# Patient Record
Sex: Female | Born: 1980 | Race: Black or African American | Hispanic: No | Marital: Married | Smoking: Never smoker
Health system: Southern US, Community
[De-identification: ages and names within clinical notes are randomized; demographics above are authoritative.]

## PROBLEM LIST (undated history)

## (undated) ENCOUNTER — Inpatient Hospital Stay: Payer: Self-pay

## (undated) DIAGNOSIS — R87629 Unspecified abnormal cytological findings in specimens from vagina: Secondary | ICD-10-CM

## (undated) DIAGNOSIS — N200 Calculus of kidney: Secondary | ICD-10-CM

## (undated) DIAGNOSIS — E559 Vitamin D deficiency, unspecified: Secondary | ICD-10-CM

## (undated) HISTORY — DX: Vitamin D deficiency, unspecified: E55.9

## (undated) HISTORY — DX: Calculus of kidney: N20.0

## (undated) HISTORY — DX: Unspecified abnormal cytological findings in specimens from vagina: R87.629

---

## 2010-12-31 DIAGNOSIS — R87629 Unspecified abnormal cytological findings in specimens from vagina: Secondary | ICD-10-CM

## 2010-12-31 HISTORY — DX: Unspecified abnormal cytological findings in specimens from vagina: R87.629

## 2012-12-31 DIAGNOSIS — O321XX0 Maternal care for breech presentation, not applicable or unspecified: Secondary | ICD-10-CM

## 2014-12-31 HISTORY — PX: OTHER SURGICAL HISTORY: SHX169

## 2016-07-10 ENCOUNTER — Encounter: Payer: Self-pay | Admitting: Obstetrics and Gynecology

## 2016-07-10 ENCOUNTER — Ambulatory Visit (INDEPENDENT_AMBULATORY_CARE_PROVIDER_SITE_OTHER): Payer: BLUE CROSS/BLUE SHIELD | Admitting: Obstetrics and Gynecology

## 2016-07-10 VITALS — BP 124/65 | HR 71 | Ht 69.0 in | Wt 156.6 lb

## 2016-07-10 DIAGNOSIS — Z331 Pregnant state, incidental: Secondary | ICD-10-CM | POA: Diagnosis not present

## 2016-07-10 DIAGNOSIS — Z98891 History of uterine scar from previous surgery: Secondary | ICD-10-CM | POA: Diagnosis not present

## 2016-07-10 DIAGNOSIS — Z3490 Encounter for supervision of normal pregnancy, unspecified, unspecified trimester: Secondary | ICD-10-CM | POA: Insufficient documentation

## 2016-07-10 DIAGNOSIS — N912 Amenorrhea, unspecified: Secondary | ICD-10-CM | POA: Diagnosis not present

## 2016-07-10 LAB — POCT URINE PREGNANCY: PREG TEST UR: POSITIVE — AB

## 2016-07-10 NOTE — Patient Instructions (Signed)
1. Return in 3 weeks for new OB nursing intake 2. Return in 5 weeks for new OB history and physical 3. Continue prenatal vitamins daily

## 2016-07-10 NOTE — Progress Notes (Signed)
GYN ENCOUNTER NOTE  Subjective:       Roberta Wagner is a 35 y.o. [id number] female is here for gynecologic evaluation of the following issues:  1. Pregnancy confirmation.    Patient reports mild nausea and breast tenderness. No vaginal bleeding or pelvic pain. Currently taking prenatal vitamins.    Gynecologic History Patient's last menstrual period was 05/28/2016 (exact date). Contraception: none Last Pap: Normal Last mammogram: N/A  Obstetric History OB History  Gravida Para Term Preterm AB SAB TAB Ectopic Multiple Living  5 2 2  2 1    2     # Outcome Date GA Lbr Len/2nd Weight Sex Delivery Anes PTL Lv  5 Current           4 SAB 2016          3 AB 2014          2 Term 2014   7 lb 1.8 oz (3.225 kg) F CS-LTranv   Y     Complications: Breech birth  1 Term 2012   7 lb 9.6 oz (3.447 kg) F Vag-Spont   Y      Past Medical History  Diagnosis Date  . Abnormal Pap smear of vagina 2012    colpo neg  . Kidney stone     Past Surgical History  Procedure Laterality Date  . Cesarean section  2014  . Kidney stone remove  2016    No current outpatient prescriptions on file prior to visit.   No current facility-administered medications on file prior to visit.    Allergies  Allergen Reactions  . Sulfa Antibiotics Hives    Social History   Social History  . Marital Status: Single    Spouse Name: N/A  . Number of Children: N/A  . Years of Education: N/A   Occupational History  . Not on file.   Social History Main Topics  . Smoking status: Never Smoker   . Smokeless tobacco: Not on file  . Alcohol Use: No  . Drug Use: No  . Sexual Activity: Yes    Birth Control/ Protection: None   Other Topics Concern  . Not on file   Social History Narrative  . 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 Breast cancer Neg Hx   . Cancer Neg Hx    . Diabetes Neg Hx   . Heart disease Neg Hx     The following portions of the patient's history were reviewed and updated as appropriate: allergies, current medications, past family history, past medical history, past social history, past surgical history and problem list.  Review of Systems Review of Systems - POSITIVE-mild nausea, breast tenderness Review of Systems - General ROS: negative for - chills, fatigue, fever, hot flashes, malaise or night sweats Hematological and Lymphatic ROS: negative for - bleeding problems or swollen lymph nodes Gastrointestinal ROS: negative for - abdominal pain, blood in stools, change in bowel habits and nausea/vomiting Musculoskeletal ROS: negative for - joint pain, muscle pain or muscular weakness Genito-Urinary ROS: negative for - change in menstrual cycle, dysmenorrhea, dyspareunia, dysuria, genital discharge, genital ulcers, hematuria, incontinence, irregular/heavy menses, nocturia or pelvic pain  Objective:   BP 124/65 mmHg  Pulse 71  Ht 5\' 9"  (1.753 m)  Wt 156 lb 9.6 oz (71.033 kg)  BMI 23.12 kg/m2  LMP 05/28/2016 (Exact Date) CONSTITUTIONAL: Well-developed, well-nourished female  in no acute distress.  Physical exam-deferred   Assessment:   1. Amenorrhea - POCT urine pregnancy  2. Pregnancy  LMP 05/28/2016   EDD 03/04/2017  EGA 6.1 weeks  3. History of cesarean section-breech     Plan:   1. Prenatal vitamins daily 2. Discussed VBAC 3.New OB counseling: The patient has been given an overview regarding routine prenatal care. Recommendations regarding diet, weight gain, and exercise in pregnancy were given. Prenatal testing, optional genetic testing, and ultrasound use in pregnancy were reviewed.  Benefits of Breast Feeding were discussed. The patient is encouraged to consider nursing her baby post partum.  A total of 25 minutes were spent face-to-face with the patient during this encounter and over half of that time involved  counseling and coordination of care.  Herold Harms, MD  Note: This dictation was prepared with Dragon dictation along with smaller phrase technology. Any transcriptional errors that result from this process are unintentional.

## 2016-07-19 ENCOUNTER — Telehealth: Payer: Self-pay | Admitting: Obstetrics and Gynecology

## 2016-07-19 MED ORDER — DOXYLAMINE-PYRIDOXINE 10-10 MG PO TBEC: 10.0000 mg | DELAYED_RELEASE_TABLET | Freq: Every day | ORAL | Status: DC

## 2016-07-19 NOTE — Telephone Encounter (Signed)
Pt had confirmation on 7/11. Now states she is very nauseated. Has vomiting x 2. She is able to hold liquids and food. Has urinated 3 x since 7am. Advised pt to drink Gatorade or Pedialyte. Eat small frequent meals. May try ginger ale or sprite. Phoned in diclegis. Gave pt instructions on med. Will f/u on sx at  nob intake.

## 2016-07-19 NOTE — Telephone Encounter (Signed)
Pt is very nauseated. 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

## 2016-07-24 ENCOUNTER — Ambulatory Visit: Payer: Self-pay | Admitting: Obstetrics and Gynecology

## 2016-07-31 ENCOUNTER — Ambulatory Visit (INDEPENDENT_AMBULATORY_CARE_PROVIDER_SITE_OTHER): Payer: BLUE CROSS/BLUE SHIELD | Admitting: Obstetrics and Gynecology

## 2016-07-31 VITALS — BP 120/60 | HR 73 | Wt 162.0 lb

## 2016-07-31 DIAGNOSIS — Z369 Encounter for antenatal screening, unspecified: Secondary | ICD-10-CM

## 2016-07-31 DIAGNOSIS — Z1389 Encounter for screening for other disorder: Secondary | ICD-10-CM

## 2016-07-31 DIAGNOSIS — Z331 Pregnant state, incidental: Secondary | ICD-10-CM

## 2016-07-31 DIAGNOSIS — Z113 Encounter for screening for infections with a predominantly sexual mode of transmission: Secondary | ICD-10-CM

## 2016-07-31 DIAGNOSIS — Z36 Encounter for antenatal screening of mother: Secondary | ICD-10-CM

## 2016-07-31 DIAGNOSIS — Z3490 Encounter for supervision of normal pregnancy, unspecified, unspecified trimester: Secondary | ICD-10-CM

## 2016-07-31 NOTE — Progress Notes (Signed)
Roberta Wagner presents for NOB nurse interview visit. G-5.  P-2022. Pregnancy education material explained and given. No cats in the home. NOB labs ordered. HIV labs and Drug screen were explained optional and she could opt out of tests but did not decline. Drug screen ordered. PNV encouraged. Vitamin B6 and unisom OTC are helping her n/v. To contact office if needs anything else for n/v. Genetic screening at present time is questionable as to what test, may either call to have done before nob visit or discuss with provider. Pt. To follow up with provider on  08/14/2016  for NOB physical.  All questions answered.

## 2016-07-31 NOTE — Patient Instructions (Signed)
Pregnancy and Zika Virus Disease Zika virus disease, or Zika, is an illness that can spread to people from mosquitoes that carry the virus. It may also spread from person to person through infected body fluids. Zika first occurred in [place], but recently it has spread to new areas. The virus occurs in tropical climates. The location of Zika continues to change. Most people who become infected with Zika virus do not develop serious illness. However, Zika may cause birth defects in an unborn baby whose mother is infected with the virus. It may also increase the risk of miscarriage. WHAT ARE THE SYMPTOMS OF ZIKA VIRUS DISEASE? In many cases, people who have been infected with Zika virus do not develop any symptoms. If symptoms appear, they usually start about a week after the person is infected. Symptoms are usually mild. They may include:  Fever.  Rash.  Red eyes.  Joint pain. HOW DOES ZIKA VIRUS DISEASE SPREAD? The main way that Zika virus spreads is through the bite of a certain type of mosquito. Unlike most types of mosquitos, which bite only at night, the type of mosquito that carries Zika virus bites both at night and during the day. Zika virus can also spread through sexual contact, through a blood transfusion, and from a mother to her baby before or during birth. Once you have had Zika virus disease, it is unlikely that you will get it again. CAN I PASS ZIKA TO MY BABY DURING PREGNANCY? Yes, Zika can pass from a mother to her baby before or during birth. WHAT PROBLEMS CAN ZIKA CAUSE FOR MY BABY? A woman who is infected with Zika virus while pregnant is at risk of having her baby born with a condition in which the brain or head is smaller than expected (microcephaly). Babies who have microcephaly can have developmental delays, seizures, hearing problems, and vision problems. Having Zika virus disease during pregnancy can also increase the risk of miscarriage. HOW CAN ZIKA VIRUS DISEASE BE  PREVENTED? There is no vaccine to prevent Zika. The best way to prevent the disease is to avoid infected mosquitoes and avoid exposure to body fluids that can spread the virus. Avoid any possible exposure to Zika by taking the following precautions. For women and their sex partners:  Avoid traveling to high-risk areas. The locations where Zika is being reported change often. To identify high-risk areas, check the CDC travel website: www.cdc.gov/zika/geo/index.html  If you or your sex partner must travel to a high-risk area, talk with a health care provider before and after traveling.  Take all precautions to avoid mosquito bites if you live in, or travel to, any of the high-risk areas. Insect repellents are safe to use during pregnancy.  Ask your health care provider when it is safe to have sexual contact. For women:  If you are pregnant or trying to become pregnant, avoid sexual contact with persons who may have been exposed to Zika virus, persons who have possible symptoms of Zika, or persons whose history you are unsure about. If you choose to have sexual contact with someone who may have been exposed to Zika virus, use condoms correctly during the entire duration of sexual activity, every time. Do not share sexual devices, as you may be exposed to body fluids.  Ask your health care provider about when it is safe to attempt pregnancy after a possible exposure to Zika virus. WHAT STEPS SHOULD I TAKE TO AVOID MOSQUITO BITES? Take these steps to avoid mosquito bites when you are   in a high-risk area:  Wear loose clothing that covers your arms and legs.  Limit your outdoor activities.  Do not open windows unless they have window screens.  Sleep under mosquito nets.  Use insect repellent. The best insect repellents have:  DEET, picaridin, oil of lemon eucalyptus (OLE), or IR3535 in them.  Higher amounts of an active ingredient in them.  Remember that insect repellents are safe to use  during pregnancy.  Do not use OLE on children who are younger than 3 years of age. Do not use insect repellent on babies who are younger than 2 months of age.  Cover your child's stroller with mosquito netting. Make sure the netting fits snugly and that any loose netting does not cover your child's mouth or nose. Do not use a blanket as a mosquito-protection cover.  Do not apply insect repellent underneath clothing.  If you are using sunscreen, apply the sunscreen before applying the insect repellent.  Treat clothing with permethrin. Do not apply permethrin directly to your skin. Follow label directions for safe use.  Get rid of standing water, where mosquitoes may reproduce. Standing water is often found in items such as buckets, bowls, animal food dishes, and flowerpots. When you return from traveling to any high-risk area, continue taking actions to protect yourself against mosquito bites for 3 weeks, even if you show no signs of illness. This will prevent spreading Zika virus to uninfected mosquitoes. WHAT SHOULD I KNOW ABOUT THE SEXUAL TRANSMISSION OF ZIKA? People can spread Zika to their sexual partners during vaginal, anal, or oral sex, or by sharing sexual devices. Many people with Zika do not develop symptoms, so a person could spread the disease without knowing that they are infected. The greatest risk is to women who are pregnant or who may become pregnant. Zika virus can live longer in semen than it can live in blood. Couples can prevent sexual transmission of the virus by:  Using condoms correctly during the entire duration of sexual activity, every time. This includes vaginal, anal, and oral sex.  Not sharing sexual devices. Sharing increases your risk of being exposed to body fluid from another person.  Avoiding all sexual activity until your health care provider says it is safe. SHOULD I BE TESTED FOR ZIKA VIRUS? A sample of your blood can be tested for Zika virus. A pregnant  woman should be tested if she may have been exposed to the virus or if she has symptoms of Zika. She may also have additional tests done during her pregnancy, such ultrasound testing. Talk with your health care provider about which tests are recommended.   This information is not intended to replace advice given to you by your health care provider. Make sure you discuss any questions you have with your health care provider.   Document Released: 09/07/2015 Document Reviewed: 08/31/2015 Elsevier Interactive Patient Education 2016 Elsevier Inc. Minor Illnesses and Medications in Pregnancy  Cold/Flu:  Sudafed for congestion- Robitussin (plain) for cough- Tylenol for discomfort.  Please follow the directions on the label.  Try not to take any more than needed.  OTC Saline nasal spray and air humidifier or cool-mist  Vaporizer to sooth nasal irritation and to loosen congestion.  It is also important to increase intake of non carbonated fluids, especially if you have a fever.  Constipation:  Colace-2 capsules at bedtime; Metamucil- follow directions on label; Senokot- 1 tablet at bedtime.  Any one of these medications can be used.  It is also   very important to increase fluids and fruits along with regular exercise.  If problem persists please call the office.  Diarrhea:  Kaopectate as directed on the label.  Eat a bland diet and increase fluids.  Avoid highly seasoned foods.  Headache:  Tylenol 1 or 2 tablets every 3-4 hours as needed  Indigestion:  Maalox, Mylanta, Tums or Rolaids- as directed on label.  Also try to eat small meals and avoid fatty, greasy or spicy foods.  Nausea with or without Vomiting:  Nausea in pregnancy is caused by increased levels of hormones in the body which influence the digestive system and cause irritation when stomach acids accumulate.  Symptoms usually subside after 1st trimester of pregnancy.  Try the following: 1. Keep saltines, graham crackers or dry toast by your bed  to eat upon awakening. 2. Don't let your stomach get empty.  Try to eat 5-6 small meals per day instead of 3 large ones. 3. Avoid greasy fatty or highly seasoned foods.  4. Take OTC Unisom 1 tablet at bed time along with OTC Vitamin B6 25-50 mg 3 times per day.    If nausea continues with vomiting and you are unable to keep down food and fluids you may need a prescription medication.  Please notify your provider.   Sore throat:  Chloraseptic spray, throat lozenges and or plain Tylenol.  Vaginal Yeast Infection:  OTC Monistat for 7 days as directed on label.  If symptoms do not resolve within a week notify provider.  If any of the above problems do not subside with recommended treatment please call the office for further assistance.   Do not take Aspirin, Advil, Motrin or Ibuprofen.  * * OTC= Over the counter Hyperemesis Gravidarum Hyperemesis gravidarum is a severe form of nausea and vomiting that happens during pregnancy. Hyperemesis is worse than morning sickness. It may cause you to have nausea or vomiting all day for many days. It may keep you from eating and drinking enough food and liquids. Hyperemesis usually occurs during the first half (the first 20 weeks) of pregnancy. It often goes away once a woman is in her second half of pregnancy. However, sometimes hyperemesis continues through an entire pregnancy.  CAUSES  The cause of this condition is not completely known but is thought to be related to changes in the body's hormones when pregnant. It could be from the high level of the pregnancy hormone or an increase in estrogen in the body.  SIGNS AND SYMPTOMS   Severe nausea and vomiting.  Nausea that does not go away.  Vomiting that does not allow you to keep any food down.  Weight loss and body fluid loss (dehydration).  Having no desire to eat or not liking food you have previously enjoyed. DIAGNOSIS  Your health care provider will do a physical exam and ask you about your  symptoms. He or she may also order blood tests and urine tests to make sure something else is not causing the problem.  TREATMENT  You may only need medicine to control the problem. If medicines do not control the nausea and vomiting, you will be treated in the hospital to prevent dehydration, increased acid in the blood (acidosis), weight loss, and changes in the electrolytes in your body that may harm the unborn baby (fetus). You may need IV fluids.  HOME CARE INSTRUCTIONS   Only take over-the-counter or prescription medicines as directed by your health care provider.  Try eating a couple of dry crackers or   toast in the morning before getting out of bed.  Avoid foods and smells that upset your stomach.  Avoid fatty and spicy foods.  Eat 5-6 small meals a day.  Do not drink when eating meals. Drink between meals.  For snacks, eat high-protein foods, such as cheese.  Eat or suck on things that have ginger in them. Ginger helps nausea.  Avoid food preparation. The smell of food can spoil your appetite.  Avoid iron pills and iron in your multivitamins until after 3-4 months of being pregnant. However, consult with your health care provider before stopping any prescribed iron pills. SEEK MEDICAL CARE IF:   Your abdominal pain increases.  You have a severe headache.  You have vision problems.  You are losing weight. SEEK IMMEDIATE MEDICAL CARE IF:   You are unable to keep fluids down.  You vomit blood.  You have constant nausea and vomiting.  You have excessive weakness.  You have extreme thirst.  You have dizziness or fainting.  You have a fever or persistent symptoms for more than 2-3 days.  You have a fever and your symptoms suddenly get worse. MAKE SURE YOU:   Understand these instructions.  Will watch your condition.  Will get help right away if you are not doing well or get worse.   This information is not intended to replace advice given to you by your  health care provider. Make sure you discuss any questions you have with your health care provider.   Document Released: 12/17/2005 Document Revised: 10/07/2013 Document Reviewed: 07/29/2013 Elsevier Interactive Patient Education 2016 Elsevier Inc. Commonly Asked Questions During Pregnancy  Cats: A parasite can be excreted in cat feces.  To avoid exposure you need to have another person empty the little box.  If you must empty the litter box you will need to wear gloves.  Wash your hands after handling your cat.  This parasite can also be found in raw or undercooked meat so this should also be avoided.  Colds, Sore Throats, Flu: Please check your medication sheet to see what you can take for symptoms.  If your symptoms are unrelieved by these medications please call the office.  Dental Work: Most any dental work your dentist recommends is permitted.  X-rays should only be taken during the first trimester if absolutely necessary.  Your abdomen should be shielded with a lead apron during all x-rays.  Please notify your provider prior to receiving any x-rays.  Novocaine is fine; gas is not recommended.  If your dentist requires a note from us prior to dental work please call the office and we will provide one for you.  Exercise: Exercise is an important part of staying healthy during your pregnancy.  You may continue most exercises you were accustomed to prior to pregnancy.  Later in your pregnancy you will most likely notice you have difficulty with activities requiring balance like riding a bicycle.  It is important that you listen to your body and avoid activities that put you at a higher risk of falling.  Adequate rest and staying well hydrated are a must!  If you have questions about the safety of specific activities ask your provider.    Exposure to Children with illness: Try to avoid obvious exposure; report any symptoms to us when noted,  If you have chicken pos, red measles or mumps, you should  be immune to these diseases.   Please do not take any vaccines while pregnant unless you have checked with   your OB provider.  Fetal Movement: After 28 weeks we recommend you do "kick counts" twice daily.  Lie or sit down in a calm quiet environment and count your baby movements "kicks".  You should feel your baby at least 10 times per hour.  If you have not felt 10 kicks within the first hour get up, walk around and have something sweet to eat or drink then repeat for an additional hour.  If count remains less than 10 per hour notify your provider.  Fumigating: Follow your pest control agent's advice as to how long to stay out of your home.  Ventilate the area well before re-entering.  Hemorrhoids:   Most over-the-counter preparations can be used during pregnancy.  Check your medication to see what is safe to use.  It is important to use a stool softener or fiber in your diet and to drink lots of liquids.  If hemorrhoids seem to be getting worse please call the office.   Hot Tubs:  Hot tubs Jacuzzis and saunas are not recommended while pregnant.  These increase your internal body temperature and should be avoided.  Intercourse:  Sexual intercourse is safe during pregnancy as long as you are comfortable, unless otherwise advised by your provider.  Spotting may occur after intercourse; report any bright red bleeding that is heavier than spotting.  Labor:  If you know that you are in labor, please go to the hospital.  If you are unsure, please call the office and let us help you decide what to do.  Lifting, straining, etc:  If your job requires heavy lifting or straining please check with your provider for any limitations.  Generally, you should not lift items heavier than that you can lift simply with your hands and arms (no back muscles)  Painting:  Paint fumes do not harm your pregnancy, but may make you ill and should be avoided if possible.  Latex or water based paints have less odor than oils.   Use adequate ventilation while painting.  Permanents & Hair Color:  Chemicals in hair dyes are not recommended as they cause increase hair dryness which can increase hair loss during pregnancy.  " Highlighting" and permanents are allowed.  Dye may be absorbed differently and permanents may not hold as well during pregnancy.  Sunbathing:  Use a sunscreen, as skin burns easily during pregnancy.  Drink plenty of fluids; avoid over heating.  Tanning Beds:  Because their possible side effects are still unknown, tanning beds are not recommended.  Ultrasound Scans:  Routine ultrasounds are performed at approximately 20 weeks.  You will be able to see your baby's general anatomy an if you would like to know the gender this can usually be determined as well.  If it is questionable when you conceived you may also receive an ultrasound early in your pregnancy for dating purposes.  Otherwise ultrasound exams are not routinely performed unless there is a medical necessity.  Although you can request a scan we ask that you pay for it when conducted because insurance does not cover " patient request" scans.  Work: If your pregnancy proceeds without complications you may work until your due date, unless your physician or employer advises otherwise.  Round Ligament Pain/Pelvic Discomfort:  Sharp, shooting pains not associated with bleeding are fairly common, usually occurring in the second trimester of pregnancy.  They tend to be worse when standing up or when you remain standing for long periods of time.  These are the result   of pressure of certain pelvic ligaments called "round ligaments".  Rest, Tylenol and heat seem to be the most effective relief.  As the womb and fetus grow, they rise out of the pelvis and the discomfort improves.  Please notify the office if your pain seems different than that described.  It may represent a more serious condition.   

## 2016-08-01 LAB — SICKLE CELL SCREEN: Sickle Cell Screen: NEGATIVE

## 2016-08-01 LAB — VARICELLA ZOSTER ANTIBODY, IGG: VARICELLA: 1468 {index} (ref >165)

## 2016-08-01 LAB — CBC WITH DIFFERENTIAL/PLATELET
BASOS ABS: 0 10*3/uL (ref 0.0–0.2)
Basos: 0 %
EOS (ABSOLUTE): 0 10*3/uL (ref 0.0–0.4)
Eos: 1 %
Hematocrit: 41.2 % (ref 34.0–46.6)
Hemoglobin: 13.9 g/dL (ref 11.1–15.9)
Immature Grans (Abs): 0 10*3/uL (ref 0.0–0.1)
Immature Granulocytes: 0 %
LYMPHS ABS: 1.7 10*3/uL (ref 0.7–3.1)
Lymphs: 26 %
MCH: 28.9 pg (ref 26.6–33.0)
MCHC: 33.7 g/dL (ref 31.5–35.7)
MCV: 86 fL (ref 79–97)
MONOS ABS: 0.5 10*3/uL (ref 0.1–0.9)
Monocytes: 8 %
NEUTROS PCT: 65 %
Neutrophils Absolute: 4.2 10*3/uL (ref 1.4–7.0)
PLATELETS: 244 10*3/uL (ref 150–379)
RBC: 4.81 x10E6/uL (ref 3.77–5.28)
RDW: 13.5 % (ref 12.3–15.4)
WBC: 6.5 10*3/uL (ref 3.4–10.8)

## 2016-08-01 LAB — ABO

## 2016-08-01 LAB — HIV ANTIBODY (ROUTINE TESTING W REFLEX): HIV SCREEN 4TH GENERATION: NONREACTIVE

## 2016-08-01 LAB — HEPATITIS B SURFACE ANTIGEN: HEP B S AG: NEGATIVE

## 2016-08-01 LAB — ANTIBODY SCREEN: ANTIBODY SCREEN: NEGATIVE

## 2016-08-01 LAB — RUBELLA SCREEN: RUBELLA: 12.6 {index} (ref >0.99)

## 2016-08-01 LAB — RH TYPE: Rh Factor: POSITIVE

## 2016-08-01 LAB — RPR: RPR: NONREACTIVE

## 2016-08-02 LAB — MONITOR DRUG PROFILE 14(MW)
AMPHETAMINE SCREEN URINE: NEGATIVE ng/mL
BARBITURATE SCREEN URINE: NEGATIVE ng/mL
BENZODIAZEPINE SCREEN, URINE: NEGATIVE ng/mL
BUPRENORPHINE, URINE: NEGATIVE ng/mL
CANNABINOIDS UR QL SCN: NEGATIVE ng/mL
COCAINE(METAB.)SCREEN, URINE: NEGATIVE ng/mL
CREATININE(CRT), U: 189.3 mg/dL (ref 20.0–300.0)
FENTANYL, URINE: NEGATIVE pg/mL
METHADONE SCREEN, URINE: NEGATIVE ng/mL
Meperidine Screen, Urine: NEGATIVE ng/mL
OPIATE SCREEN URINE: NEGATIVE ng/mL
OXYCODONE+OXYMORPHONE UR QL SCN: NEGATIVE ng/mL
PHENCYCLIDINE QUANTITATIVE URINE: NEGATIVE ng/mL
PROPOXYPHENE SCREEN URINE: NEGATIVE ng/mL
Ph of Urine: 6.5 (ref 4.5–8.9)
SPECIFIC GRAVITY: 1.027
TRAMADOL SCREEN, URINE: NEGATIVE ng/mL

## 2016-08-02 LAB — URINALYSIS, ROUTINE W REFLEX MICROSCOPIC
Bilirubin, UA: NEGATIVE
Glucose, UA: NEGATIVE
Ketones, UA: NEGATIVE
LEUKOCYTES UA: NEGATIVE
Nitrite, UA: NEGATIVE
PH UA: 6.5 (ref 5.0–7.5)
PROTEIN UA: NEGATIVE
RBC, UA: NEGATIVE
Specific Gravity, UA: 1.025 (ref 1.005–1.030)
UUROB: 1 mg/dL (ref 0.2–1.0)

## 2016-08-02 LAB — GC/CHLAMYDIA PROBE AMP
CHLAMYDIA, DNA PROBE: NEGATIVE
NEISSERIA GONORRHOEAE BY PCR: NEGATIVE

## 2016-08-02 LAB — NICOTINE SCREEN, URINE: COTININE UR QL SCN: NEGATIVE ng/mL

## 2016-08-03 LAB — CULTURE, OB URINE

## 2016-08-03 LAB — URINE CULTURE, OB REFLEX

## 2016-08-14 ENCOUNTER — Encounter: Payer: Self-pay | Admitting: Obstetrics and Gynecology

## 2016-08-14 ENCOUNTER — Ambulatory Visit (INDEPENDENT_AMBULATORY_CARE_PROVIDER_SITE_OTHER): Payer: BLUE CROSS/BLUE SHIELD | Admitting: Obstetrics and Gynecology

## 2016-08-14 VITALS — BP 114/59 | HR 86 | Wt 163.2 lb

## 2016-08-14 DIAGNOSIS — O9989 Other specified diseases and conditions complicating pregnancy, childbirth and the puerperium: Secondary | ICD-10-CM

## 2016-08-14 DIAGNOSIS — Z3491 Encounter for supervision of normal pregnancy, unspecified, first trimester: Secondary | ICD-10-CM

## 2016-08-14 DIAGNOSIS — O09291 Supervision of pregnancy with other poor reproductive or obstetric history, first trimester: Secondary | ICD-10-CM

## 2016-08-14 DIAGNOSIS — O99891 Other specified diseases and conditions complicating pregnancy: Secondary | ICD-10-CM

## 2016-08-14 DIAGNOSIS — Z98891 History of uterine scar from previous surgery: Secondary | ICD-10-CM

## 2016-08-14 DIAGNOSIS — R8271 Bacteriuria: Secondary | ICD-10-CM

## 2016-08-14 DIAGNOSIS — Z3481 Encounter for supervision of other normal pregnancy, first trimester: Secondary | ICD-10-CM

## 2016-08-14 DIAGNOSIS — R110 Nausea: Secondary | ICD-10-CM

## 2016-08-14 DIAGNOSIS — Z8742 Personal history of other diseases of the female genital tract: Secondary | ICD-10-CM

## 2016-08-14 LAB — POCT URINALYSIS DIPSTICK
Bilirubin, UA: NEGATIVE
Blood, UA: NEGATIVE
Glucose, UA: NEGATIVE
KETONES UA: NEGATIVE
LEUKOCYTES UA: NEGATIVE
NITRITE UA: NEGATIVE
PH UA: 6.5
PROTEIN UA: NEGATIVE
Spec Grav, UA: 1.015
Urobilinogen, UA: NEGATIVE

## 2016-08-14 NOTE — Progress Notes (Signed)
OBSTETRIC INITIAL PRENATAL VISIT  Subjective:    Roberta Wagner is being seen today for her first obstetrical visit.  This is not a planned pregnancy. She is a 35 y.o. [id number] female at 2615w1d gestation, Estimated Date of Delivery: 03/04/17 with Patient's last menstrual period was 05/28/2016 (exact date).  Her obstetrical history is significant for h/o miscarriage. Relationship with FOB: significant other, living together. Patient does intend to breast feed. Pregnancy history fully reviewed.   Obstetric History   G5   P2   T2   P0   A2   L2    SAB0   TAB0   Ectopic0   Multiple0   Live Births2     # Outcome Date GA Lbr Len/2nd Weight Sex Delivery Anes PTL Lv  5 Current           4 SAB 2016          3 AB 2014          2 Term 2014 5895w0d  7 lb 1.8 oz (3.225 kg) F CS-LTranv   LIV     Complications: Breech birth  1 Term 2012 2510w0d  7 lb 9.6 oz (3.447 kg) F Vag-Spont   LIV      Gynecologic History:  Last pap smear was ~ 3-4 years ago. Results were normal.  Admits h/o abnormal pap smear in the past (2012, colposcopy was normal). .  Denies history of STIs.    Past Medical History:  Diagnosis Date  . Abnormal Pap smear of vagina 2012   colpo neg  . Kidney stone      Family History  Problem Relation Age of Onset  . Breast cancer Neg Hx   . Cancer Neg Hx   . Diabetes Neg Hx   . Heart disease Neg Hx      Past Surgical History:  Procedure Laterality Date  . CESAREAN SECTION  2014  . kidney stone remove  2016     Social History   Social History  . Marital status: Single    Spouse name: N/A  . Number of children: N/A  . Years of education: N/A   Occupational History  . Not on file.   Social History Main Topics  . Smoking status: Never Smoker  . Smokeless tobacco: Never Used  . Alcohol use No  . Drug use: No  . Sexual activity: Yes    Birth control/ protection: None     Comment: Pregnant    Other Topics Concern  . Not on file   Social History Narrative  .  No narrative on file     Current Outpatient Prescriptions on File Prior to Visit  Medication Sig Dispense Refill  . Doxylamine-Pyridoxine 10-10 MG TBEC Take 10 mg by mouth daily. 120 tablet 1  . Prenatal Vit-Fe Fumarate-FA (MULTIVITAMIN-PRENATAL) 27-0.8 MG TABS tablet Take 1 tablet by mouth daily at 12 noon.     No current facility-administered medications on file prior to visit.      Allergies  Allergen Reactions  . Sulfa Antibiotics Hives     Review of Systems General:Not Present- Fever, Weight Loss and Weight Gain. Skin:Not Present- Rash. HEENT:Not Present- Blurred Vision, Headache and Bleeding Gums. Respiratory:Not Present- Difficulty Breathing. Breast:Not Present- Breast Mass. Cardiovascular:Not Present- Chest Pain, Elevated Blood Pressure, Fainting / Blacking Out and Shortness of Breath. Gastrointestinal:Present - Nausea (controlled with Diclegis). Not Present- Abdominal Pain, Constipation, and Vomiting. Female Genitourinary:Not Present- Frequency, Painful Urination, Pelvic Pain, Vaginal Bleeding, Vaginal Discharge, Contractions,  regular, Fetal Movements Decreased, Urinary Complaints and Vaginal Fluid. Musculoskeletal:Not Present- Back Pain and Leg Cramps. Neurological:Not Present- Dizziness. Psychiatric:Not Present- Depression.     Objective:   Blood pressure (!) 114/59, pulse 86, weight 163 lb 3.2 oz (74 kg), last menstrual period 05/28/2016.  Body mass index is 24.1 kg/m.   General Appearance:    Alert, cooperative, no distress, appears stated age  Head:    Normocephalic, without obvious abnormality, atraumatic  Eyes:    PERRL, conjunctiva/corneas clear, EOM's intact, both eyes  Ears:    Normal external ear canals, both ears  Nose:   Nares normal, septum midline, mucosa normal, no drainage or sinus tenderness  Throat:   Lips, mucosa, and tongue normal; teeth and gums normal  Neck:   Supple, symmetrical, trachea midline, no adenopathy; thyroid: no  enlargement/tenderness/nodules; no carotid bruit or JVD  Back:     Symmetric, no curvature, ROM normal, no CVA tenderness  Lungs:     Clear to auscultation bilaterally, respirations unlabored  Chest Wall:    No tenderness or deformity   Heart:    Regular rate and rhythm, S1 and S2 normal, no murmur, rub or gallop  Breast Exam:    No tenderness, masses, or nipple abnormality  Abdomen:     Soft, non-tender, bowel sounds active all four quadrants, no masses, no organomegaly.  FHT 164 bpm.  Genitalia:    Pelvic:external genitalia normal, vagina without lesions, discharge, or tenderness, rectovaginal septum  normal. Cervix normal in appearance, no cervical motion tenderness, no adnexal masses or tenderness.  Pregnancy positive findings: uterine enlargement: 11 wk size, nontender.   Rectal:    Normal external sphincter.  No hemorrhoids appreciated. Internal exam not done.   Extremities:   Extremities normal, atraumatic, no cyanosis or edema  Pulses:   2+ and symmetric all extremities  Skin:   Skin color, texture, turgor normal, no rashes or lesions  Lymph nodes:   Cervical, supraclavicular, and axillary nodes normal  Neurologic:   CNII-XII intact, normal strength, sensation and reflexes throughout    Assessment:    Pregnancy at 11 and 1/7 weeks   Nausea of pregnancy  H/o abnormal pap smear  Asymptomatic bacteriuria H/o miscarriages H/o previous C-section  Plan:   1. Pregnancy at 11.1 weeks - Initial labs reviewed. - Prenatal vitamins encouraged. - Problem list reviewed and updated. - New OB counseling:  The patient has been given an overview regarding routine prenatal care.  Recommendations regarding diet, weight gain, and exercise in pregnancy were given.  - Prenatal testing, optional genetic testing, and ultrasound use in pregnancy were reviewed.  Panorama done today at patient's request.   enefits of Breast Feeding were discussed. The patient is encouraged to consider nursing her baby  post partum.  2. Nausea of pregnancy - Continue Diclegis as needed for nausea.   3. Asymptomatic bacteriuria - Klebsiella noted on NOB urine culture (only 25K-50K colonies noted).  Will repeat today. Will treat if over 75K colonies.   4. H/o abnormal pap smear - Pap smear done today.  5. H/o miscarriage - FHT appreciated today. Given precautions.   6 H/o 1 prior C-section.  -  Discussed option of TOLAC vs repeat C-section.  Patient is a candidate for TOLAC as indication for C-section was breech presentation.  Patient undecided, but may be leaning more towards TOLAC.  Will discuss again at future visits  Follow up in 4 weeks.  50% of 30 min visit spent on counseling and coordination of  care.

## 2016-08-15 LAB — URINE CULTURE

## 2016-08-16 ENCOUNTER — Encounter: Payer: Self-pay | Admitting: Obstetrics and Gynecology

## 2016-08-17 ENCOUNTER — Encounter: Payer: Self-pay | Admitting: Obstetrics and Gynecology

## 2016-08-17 DIAGNOSIS — R87610 Atypical squamous cells of undetermined significance on cytologic smear of cervix (ASC-US): Secondary | ICD-10-CM | POA: Insufficient documentation

## 2016-08-17 LAB — PAP IG AND HPV HIGH-RISK
HPV, high-risk: NEGATIVE
PAP SMEAR COMMENT: 0

## 2016-09-07 ENCOUNTER — Encounter: Payer: Self-pay | Admitting: Obstetrics and Gynecology

## 2016-09-18 ENCOUNTER — Ambulatory Visit (INDEPENDENT_AMBULATORY_CARE_PROVIDER_SITE_OTHER): Payer: BLUE CROSS/BLUE SHIELD | Admitting: Obstetrics and Gynecology

## 2016-09-18 VITALS — BP 119/55 | HR 77 | Wt 165.4 lb

## 2016-09-18 DIAGNOSIS — Z3492 Encounter for supervision of normal pregnancy, unspecified, second trimester: Secondary | ICD-10-CM

## 2016-09-18 DIAGNOSIS — Z3482 Encounter for supervision of other normal pregnancy, second trimester: Secondary | ICD-10-CM

## 2016-09-18 DIAGNOSIS — R87610 Atypical squamous cells of undetermined significance on cytologic smear of cervix (ASC-US): Secondary | ICD-10-CM

## 2016-09-18 LAB — POCT URINALYSIS DIPSTICK
Bilirubin, UA: NEGATIVE
Glucose, UA: NEGATIVE
Ketones, UA: NEGATIVE
NITRITE UA: NEGATIVE
PROTEIN UA: NEGATIVE
RBC UA: NEGATIVE
UROBILINOGEN UA: NEGATIVE
pH, UA: 6.5

## 2016-09-18 NOTE — Patient Instructions (Signed)

## 2016-09-18 NOTE — Progress Notes (Signed)
ROB: Doing well, no complaints. Changed to gummy vitamins, doing well.  Normal Panorama screen. Discussed pap smear (ASCUS but neg HPV).  Continue routine screening. RTC in 4 weeks, for anatomy scan at that time.

## 2016-10-01 ENCOUNTER — Telehealth: Payer: Self-pay | Admitting: Obstetrics and Gynecology

## 2016-10-01 NOTE — Telephone Encounter (Signed)
Okie this pt said she needs a note stating that she needs to stop going to the gym so they can freeze her account while she's pregnant. She said she just wants to walk. And I'm serious

## 2016-10-16 ENCOUNTER — Ambulatory Visit (INDEPENDENT_AMBULATORY_CARE_PROVIDER_SITE_OTHER): Payer: Self-pay | Admitting: Obstetrics and Gynecology

## 2016-10-16 ENCOUNTER — Ambulatory Visit (INDEPENDENT_AMBULATORY_CARE_PROVIDER_SITE_OTHER): Payer: Self-pay

## 2016-10-16 VITALS — BP 122/58 | HR 93 | Wt 171.0 lb

## 2016-10-16 DIAGNOSIS — Z3482 Encounter for supervision of other normal pregnancy, second trimester: Secondary | ICD-10-CM

## 2016-10-16 DIAGNOSIS — Z23 Encounter for immunization: Secondary | ICD-10-CM

## 2016-10-16 LAB — POCT URINALYSIS DIPSTICK
Bilirubin, UA: NEGATIVE
Glucose, UA: NEGATIVE
Ketones, UA: NEGATIVE
NITRITE UA: NEGATIVE
PH UA: 6.5
Spec Grav, UA: 1.02
UROBILINOGEN UA: NEGATIVE

## 2016-10-16 MED ORDER — CLONAZEPAM 0.5 MG PO TABS: 0.5000 mg | ORAL_TABLET | Freq: Two times a day (BID) | ORAL | 5 refills | Status: DC | PRN

## 2016-10-16 MED ORDER — ACYCLOVIR 400 MG PO TABS: 400.0000 mg | ORAL_TABLET | Freq: Two times a day (BID) | ORAL | 11 refills | Status: DC

## 2016-10-16 MED ORDER — LAMOTRIGINE 100 MG PO TABS: 100.0000 mg | ORAL_TABLET | Freq: Every day | ORAL | 6 refills | Status: DC

## 2016-10-16 NOTE — Progress Notes (Signed)
ROB: Doing well, no complaints.  For anatomy scan later today.  For flu vaccine today. RTC in 4 weeks.

## 2016-11-13 ENCOUNTER — Ambulatory Visit (INDEPENDENT_AMBULATORY_CARE_PROVIDER_SITE_OTHER): Payer: Medicaid Other | Admitting: Obstetrics and Gynecology

## 2016-11-13 VITALS — BP 114/60 | HR 96 | Wt 176.2 lb

## 2016-11-13 DIAGNOSIS — Z3482 Encounter for supervision of other normal pregnancy, second trimester: Secondary | ICD-10-CM

## 2016-11-13 DIAGNOSIS — Z131 Encounter for screening for diabetes mellitus: Secondary | ICD-10-CM

## 2016-11-13 LAB — POCT URINALYSIS DIPSTICK
Bilirubin, UA: NEGATIVE
Blood, UA: NEGATIVE
Glucose, UA: NEGATIVE
KETONES UA: NEGATIVE
NITRITE UA: NEGATIVE
PH UA: 6.5
PROTEIN UA: NEGATIVE
Spec Grav, UA: 1.02
UROBILINOGEN UA: NEGATIVE

## 2016-11-13 NOTE — Progress Notes (Signed)
ROB: Patient doing well, no complaints. RTC in 4 weeks. For 28 week labs at that time.

## 2016-12-13 ENCOUNTER — Telehealth: Payer: Self-pay

## 2016-12-13 ENCOUNTER — Ambulatory Visit (INDEPENDENT_AMBULATORY_CARE_PROVIDER_SITE_OTHER): Payer: Medicaid Other | Admitting: Obstetrics and Gynecology

## 2016-12-13 ENCOUNTER — Other Ambulatory Visit: Payer: Medicaid Other

## 2016-12-13 VITALS — BP 94/55 | HR 88 | Wt 185.4 lb

## 2016-12-13 DIAGNOSIS — Z131 Encounter for screening for diabetes mellitus: Secondary | ICD-10-CM

## 2016-12-13 DIAGNOSIS — Z3482 Encounter for supervision of other normal pregnancy, second trimester: Secondary | ICD-10-CM

## 2016-12-13 DIAGNOSIS — Z3483 Encounter for supervision of other normal pregnancy, third trimester: Secondary | ICD-10-CM | POA: Diagnosis not present

## 2016-12-13 DIAGNOSIS — Z23 Encounter for immunization: Secondary | ICD-10-CM

## 2016-12-13 DIAGNOSIS — R42 Dizziness and giddiness: Secondary | ICD-10-CM

## 2016-12-13 LAB — POCT URINALYSIS DIPSTICK
BILIRUBIN UA: NEGATIVE
Blood, UA: NEGATIVE
GLUCOSE UA: NEGATIVE
KETONES UA: NEGATIVE
LEUKOCYTES UA: NEGATIVE
Nitrite, UA: NEGATIVE
PROTEIN UA: NEGATIVE
SPEC GRAV UA: 1.01
Urobilinogen, UA: NEGATIVE
pH, UA: 7.5

## 2016-12-13 MED ORDER — TETANUS-DIPHTH-ACELL PERTUSSIS 5-2.5-18.5 LF-MCG/0.5 IM SUSP
0.5000 mL | Freq: Once | INTRAMUSCULAR | Status: AC
  Administered 2016-12-13: 0.5 mL via INTRAMUSCULAR

## 2016-12-13 NOTE — Telephone Encounter (Signed)
Call transferred from front desk. Pt states she went to urinate after taking a nap and saw blood on the TP. Was seen for rob this am. NO cramps. Pos FM. Pt did have IC this am. Advised pt to monitor. If bleeding persist, cramping, or pelvic pain to contact office. Pt voices understanding.

## 2016-12-13 NOTE — Progress Notes (Signed)
ROB: Patient c/o feeling dizzy and lightheaded often, today BP 94/55. Heart rate 88. Uncertain as to etiology of symptoms. Will check CBC and Glucola today. If anemic, we will recommend starting iron therapy in addition to prenatal vitamins. Patient is aware of plan and will wait for results before starting iron

## 2016-12-13 NOTE — Addendum Note (Signed)
Addended by: Frankey ShownGLANTON, Mcguire Gasparyan C on: 12/13/2016 09:50 AM   Modules accepted: Orders

## 2016-12-13 NOTE — Patient Instructions (Signed)
1. Glucola and CBC are obtained today 2. Begin iron therapy 1 tablet twice a day IF anemia is confirmed 3. Return in 2 weeks for regular OB appointment

## 2016-12-14 LAB — CBC
Hematocrit: 34 % (ref 34.0–46.6)
Hemoglobin: 11.7 g/dL (ref 11.1–15.9)
MCH: 28.3 pg (ref 26.6–33.0)
MCHC: 34.4 g/dL (ref 31.5–35.7)
MCV: 82 fL (ref 79–97)
Platelets: 238 10*3/uL (ref 150–379)
RBC: 4.14 x10E6/uL (ref 3.77–5.28)
RDW: 12.9 % (ref 12.3–15.4)
WBC: 7.2 10*3/uL (ref 3.4–10.8)

## 2016-12-14 LAB — GLUCOSE, 1 HOUR GESTATIONAL: GESTATIONAL DIABETES SCREEN: 76 mg/dL (ref 65–139)

## 2016-12-26 ENCOUNTER — Inpatient Hospital Stay
Admission: EM | Admit: 2016-12-26 | Discharge: 2016-12-27 | Disposition: A | Discharge status: Home / Self Care | Payer: Medicaid Other | Attending: Obstetrics and Gynecology | Admitting: Obstetrics and Gynecology

## 2016-12-26 DIAGNOSIS — O4700 False labor before 37 completed weeks of gestation, unspecified trimester: Secondary | ICD-10-CM

## 2016-12-26 DIAGNOSIS — Z3A30 30 weeks gestation of pregnancy: Secondary | ICD-10-CM | POA: Insufficient documentation

## 2016-12-26 DIAGNOSIS — O479 False labor, unspecified: Secondary | ICD-10-CM

## 2016-12-27 DIAGNOSIS — Z3A30 30 weeks gestation of pregnancy: Secondary | ICD-10-CM | POA: Diagnosis not present

## 2016-12-27 DIAGNOSIS — O479 False labor, unspecified: Secondary | ICD-10-CM

## 2016-12-27 DIAGNOSIS — O4700 False labor before 37 completed weeks of gestation, unspecified trimester: Secondary | ICD-10-CM

## 2016-12-27 LAB — URINALYSIS, ROUTINE W REFLEX MICROSCOPIC
Bilirubin Urine: NEGATIVE
Glucose, UA: NEGATIVE mg/dL
HGB URINE DIPSTICK: NEGATIVE
KETONES UR: NEGATIVE mg/dL
NITRITE: NEGATIVE
PROTEIN: NEGATIVE mg/dL
Specific Gravity, Urine: 1.014 (ref 1.005–1.030)
pH: 6 (ref 5.0–8.0)

## 2016-12-27 MED ORDER — ACETAMINOPHEN 500 MG PO TABS
1000.0000 mg | ORAL_TABLET | Freq: Once | ORAL | Status: AC
  Administered 2016-12-27: 1000 mg via ORAL
  Filled 2016-12-27: qty 2

## 2016-12-27 MED ORDER — LACTATED RINGERS IV SOLN
Freq: Once | INTRAVENOUS | Status: AC
  Administered 2016-12-27: 01:00:00 via INTRAVENOUS

## 2016-12-27 NOTE — OB Triage Note (Signed)
Patient came in for observation for labor evaluation. Patient reports contractions every three to five minutes. Patient denies leaking of fluid, denies vaginal bleeding and spotting. Patient states she has been feeling the baby move fine. Vital signs stable and patient afebrile. FHR baseline 145 with moderate variability with accelerations 15 x 15 and no decelerations. Husband at bedside. Will continue to monitor.

## 2016-12-27 NOTE — Discharge Summary (Signed)
Patient discharged with instructions on labor precautions, information for Tylenol for pain medication follow up appointment, when to seek medical attention. Patient discharged with steady gait and no complaints. Patient discharged with husband.

## 2016-12-31 NOTE — L&D Delivery Note (Signed)
Delivery Summary for Danaher CorporationMarshevet Diiorio  Labor Events:   Preterm labor:   Rupture date:   Rupture time:   Rupture type: Spontaneous  Fluid Color: Clear  Induction:   Augmentation:   Complications:   Cervical ripening:          Delivery:   Episiotomy:   Lacerations:   Repair suture:   Repair # of packets:   Blood loss (ml): 150   Information for the patient's newborn:  Jannett CelestineHooker, Boy Shainna [[id number]][[id number]]    Delivery 03/05/2017 9:26 PM by  VBAC, Spontaneous Sex:  female Gestational Age: 3541w1d Delivery Clinician:   Living?:         APGARS  One minute Five minutes Ten minutes  Skin color:        Heart rate:        Grimace:        Muscle tone:        Breathing:        Totals: 8  9      Presentation/position:      Resuscitation:   Cord information:    Disposition of cord blood:     Blood gases sent?  Complications:   Placenta: Delivered:       appearance Newborn Measurements: Weight: 7 lb 9.7 oz (3450 g)  Height: 19.69"  Head circumference:    Chest circumference:    Other providers:    Additional  information: Forceps:   Vacuum:   Breech:   Observed anomalies       Delivery Note At 9:26 PM a viable and healthy female was delivered via VBAC, Spontaneous (Presentation: vertex; LOA position).  APGAR: 8, 9; weight 7 lb 9.7 oz (3450 g).   Placenta status: spontaneously removed, intact.  Cord: 3-vessel with the following complications: None.  Cord pH: not obtained.  Anesthesia:  Epidural Episiotomy: None Lacerations: None Suture Repair: None Est. Blood Loss (mL):  150  Mom to postpartum.  Baby to Couplet care / Skin to Skin.  Hildred Lasernika Nazir Hacker 03/05/2017, 10:02 PM

## 2017-01-01 ENCOUNTER — Ambulatory Visit (INDEPENDENT_AMBULATORY_CARE_PROVIDER_SITE_OTHER): Payer: Medicaid Other | Admitting: Obstetrics and Gynecology

## 2017-01-01 VITALS — BP 113/64 | HR 80 | Wt 186.9 lb

## 2017-01-01 DIAGNOSIS — Z3483 Encounter for supervision of other normal pregnancy, third trimester: Secondary | ICD-10-CM

## 2017-01-01 DIAGNOSIS — O219 Vomiting of pregnancy, unspecified: Secondary | ICD-10-CM

## 2017-01-01 LAB — POCT URINALYSIS DIPSTICK
BILIRUBIN UA: NEGATIVE
GLUCOSE UA: NEGATIVE
KETONES UA: NEGATIVE
Nitrite, UA: NEGATIVE
PH UA: 7
Protein, UA: NEGATIVE
SPEC GRAV UA: 1.02
Urobilinogen, UA: NEGATIVE

## 2017-01-01 MED ORDER — DOXYLAMINE-PYRIDOXINE 10-10 MG PO TBEC: 10.0000 mg | DELAYED_RELEASE_TABLET | ORAL | 1 refills | Status: DC

## 2017-01-01 NOTE — Progress Notes (Signed)
ROB: Doing well.  Notes Roberta PeltonBraxton Hicks. Was seen in triage last weekend for strong, regular contractions, was ruled out for preterm labor. Notes intolerance to current iron pills.  Given Ferrolate samples.  Discussed blood consent and cord blood banking.  Received Tdap last visit.  Patient's insurance changed to Medicaid, completed home pregnancy form today. RTC in 2 weeks.

## 2017-01-01 NOTE — Progress Notes (Deleted)
Why?  

## 2017-01-06 ENCOUNTER — Observation Stay
Admission: EM | Admit: 2017-01-06 | Discharge: 2017-01-06 | Disposition: A | Discharge status: Home / Self Care | Payer: Medicaid Other | Attending: Obstetrics and Gynecology | Admitting: Obstetrics and Gynecology

## 2017-01-06 DIAGNOSIS — Z3490 Encounter for supervision of normal pregnancy, unspecified, unspecified trimester: Secondary | ICD-10-CM | POA: Diagnosis not present

## 2017-01-06 DIAGNOSIS — R112 Nausea with vomiting, unspecified: Secondary | ICD-10-CM | POA: Diagnosis not present

## 2017-01-06 DIAGNOSIS — K529 Noninfective gastroenteritis and colitis, unspecified: Secondary | ICD-10-CM | POA: Diagnosis not present

## 2017-01-06 DIAGNOSIS — O2693 Pregnancy related conditions, unspecified, third trimester: Secondary | ICD-10-CM | POA: Diagnosis not present

## 2017-01-06 DIAGNOSIS — O26893 Other specified pregnancy related conditions, third trimester: Secondary | ICD-10-CM | POA: Diagnosis present

## 2017-01-06 DIAGNOSIS — Z3A31 31 weeks gestation of pregnancy: Secondary | ICD-10-CM | POA: Insufficient documentation

## 2017-01-06 LAB — URINALYSIS, ROUTINE W REFLEX MICROSCOPIC
BILIRUBIN URINE: NEGATIVE
Glucose, UA: NEGATIVE mg/dL
Hgb urine dipstick: NEGATIVE
KETONES UR: 80 mg/dL — AB
Nitrite: NEGATIVE
PH: 6 (ref 5.0–8.0)
PROTEIN: 30 mg/dL — AB
Specific Gravity, Urine: 1.025 (ref 1.005–1.030)

## 2017-01-06 MED ORDER — LACTATED RINGERS IV SOLN
INTRAVENOUS | Status: DC
  Administered 2017-01-06: 04:00:00 via INTRAVENOUS

## 2017-01-06 MED ORDER — LOPERAMIDE HCL 2 MG PO CAPS
2.0000 mg | ORAL_CAPSULE | ORAL | Status: DC | PRN
  Administered 2017-01-06: 2 mg via ORAL
  Filled 2017-01-06: qty 1

## 2017-01-06 MED ORDER — ONDANSETRON 4 MG PO TBDP
4.0000 mg | ORAL_TABLET | Freq: Once | ORAL | Status: AC | PRN
  Administered 2017-01-06: 4 mg via ORAL
  Filled 2017-01-06: qty 1

## 2017-01-06 NOTE — Final Progress Note (Signed)
L&D OB Triage Note  Ricke HeyMarshevet Gray is a 36 y.o. Z6X0960G5P2022 female at 3739w4d, EDD Estimated Date of Delivery: 03/04/17 who presented to triage for complaints of contractions q 3-5 min.  She was evaluated by the nurses with /findings significant for preterm contractions, ruled out for labor. Vital signs stable. An NST was performed and has been reviewed by MD. She was treated with IVF bolus and 1 dose of terbutaline IM.   NST INTERPRETATION: Indications: rule out uterine contractions  Mode: External Baseline Rate (A): 145 bpm Variability: Moderate Accelerations: 15 x 15 Decelerations: None     Contraction Frequency (min): occas  Impression: reactive   Plan: NST performed was reviewed and was found to be reactive. She was discharged home with bleeding/lpreterm abor precautions.  Continue routine prenatal care. Follow up with OB/GYN as previously scheduled.     Hildred LaserAnika Romaldo Saville, MD [facility]

## 2017-01-06 NOTE — OB Triage Note (Signed)
Patient arrived to triage with complaints of N/V/D since approx 1900 last night.  Patient states she is "not able to keep anything down".  Positive fetal movement noted by patient and by palpation.  Patient denies feeling contractions, leaking of fluid, or vaginal bleeding.  EFM applied. Discussed plan of care. Patient verbalized understanding.

## 2017-01-06 NOTE — Progress Notes (Signed)
Nausea improved after ODT zofran, patient reports she has not had any "dry heaves" since administration. However, patient now feeling contractions. IV fluid bolus initiated as ordered.

## 2017-01-06 NOTE — OB Triage Note (Signed)
Patient resting in bed quietly with eyes closed, arouses to voice.  Patient states, "my stomach started hurting after I took that immodium".  Discussed plan of care for PO challenge. Patient does not feel she can tolerate crackers at this time- left at bedside for patient to eat when stomach pain eases. Discussed contractions.  Patient feels contractions but they are not painful at this time. Plan to PO challenge when ready, and continue IVF and EFM.

## 2017-01-06 NOTE — Progress Notes (Signed)
Dr. Valentino Saxonherry notified of UA results.  Aware that patient tolerating PO fluids, but unable to eat at this point in time.  Aware that patient has completed a 1 liter LR bolus.  Orders to discharge to home and encourage patient to continue to hydrate at home with PO fluids.

## 2017-01-06 NOTE — Discharge Summary (Signed)
L&D OB Triage Note  Ricke HeyMarshevet Deroche is a 36 y.o. Z6X0960G5P2022 female at 7148w6d, EDD Estimated Date of Delivery: 03/04/17 who presented to triage for complaints of nausea/vomiting and diarrhea x 1 day.  Notes recent sick contact with her nephew 2-3 days ago who had similar symptoms. She was evaluated by the nurses with findings significant for gastroenteritis and irregular uterine contractions. Vital signs stable. An NST was performed and has been reviewed by MD. She was treated with IVF bolus, Zofran ODT, and Imodium.   NST INTERPRETATION: Indications: rule out uterine contractions  Mode: External Baseline Rate (A): 145 bpm Variability: Moderate Accelerations: 15 x 15 Decelerations: None     Contraction Frequency (min): irritability; irreg ctx  Impression: reactive   Plan: NST performed was reviewed and was found to be reactive. Patient tolerated PO challenge after IVF, Zofran.  No further episodes of diarrhea after Imodium.  Contractions spaced out, mostly irritability..She was discharged home.  Advised on use of Pepto-Bismol or Kaopectate for symptoms over next 24 hrs.  Continue routine prenatal care. Follow up with OB/GYN as previously scheduled.     Hildred LaserAnika Earon Rivest, MD [facility]

## 2017-01-15 ENCOUNTER — Encounter: Payer: Self-pay | Admitting: Certified Nurse Midwife

## 2017-01-15 ENCOUNTER — Ambulatory Visit (INDEPENDENT_AMBULATORY_CARE_PROVIDER_SITE_OTHER): Payer: Medicaid Other | Admitting: Certified Nurse Midwife

## 2017-01-15 VITALS — BP 123/60 | HR 84 | Wt 187.4 lb

## 2017-01-15 DIAGNOSIS — Z3493 Encounter for supervision of normal pregnancy, unspecified, third trimester: Secondary | ICD-10-CM

## 2017-01-15 DIAGNOSIS — M545 Low back pain, unspecified: Secondary | ICD-10-CM

## 2017-01-15 LAB — POCT URINALYSIS DIPSTICK
BILIRUBIN UA: NEGATIVE
Blood, UA: NEGATIVE
GLUCOSE UA: NEGATIVE
KETONES UA: NEGATIVE
Nitrite, UA: NEGATIVE
Protein, UA: NEGATIVE
Spec Grav, UA: 1.015
Urobilinogen, UA: NEGATIVE
pH, UA: 5

## 2017-01-15 NOTE — Progress Notes (Signed)
Pt is c/o cramping in back.

## 2017-01-15 NOTE — Progress Notes (Signed)
ROB-Pt doing well. Significant other attended visit via FaceTime. Discussed round ligament pain and back pain in pregnancy. Reviewed home treatment measures including warm bath, adequate hydration, abdominal support, and topical agents. Verbalized understanding of red flag symptoms and when to call.. RTC x 2 wks for ROB.

## 2017-01-18 LAB — URINE CULTURE

## 2017-01-20 ENCOUNTER — Inpatient Hospital Stay
Admission: EM | Admit: 2017-01-20 | Discharge: 2017-01-20 | Disposition: A | Discharge status: Home / Self Care | Payer: Medicaid Other | Attending: Obstetrics & Gynecology | Admitting: Obstetrics & Gynecology

## 2017-01-20 DIAGNOSIS — O479 False labor, unspecified: Secondary | ICD-10-CM | POA: Diagnosis present

## 2017-01-20 DIAGNOSIS — Z888 Allergy status to other drugs, medicaments and biological substances status: Secondary | ICD-10-CM | POA: Diagnosis not present

## 2017-01-20 DIAGNOSIS — O4700 False labor before 37 completed weeks of gestation, unspecified trimester: Secondary | ICD-10-CM | POA: Diagnosis present

## 2017-01-20 DIAGNOSIS — Z3A33 33 weeks gestation of pregnancy: Secondary | ICD-10-CM | POA: Insufficient documentation

## 2017-01-20 DIAGNOSIS — Z3490 Encounter for supervision of normal pregnancy, unspecified, unspecified trimester: Secondary | ICD-10-CM

## 2017-01-20 DIAGNOSIS — Z79899 Other long term (current) drug therapy: Secondary | ICD-10-CM | POA: Insufficient documentation

## 2017-01-20 DIAGNOSIS — Z98891 History of uterine scar from previous surgery: Secondary | ICD-10-CM

## 2017-01-20 MED ORDER — NIFEDIPINE 10 MG PO CAPS: 10.0000 mg | ORAL_CAPSULE | Freq: Three times a day (TID) | ORAL | 0 refills | Status: DC | PRN

## 2017-01-20 MED ORDER — NIFEDIPINE 10 MG PO CAPS
10.0000 mg | ORAL_CAPSULE | Freq: Once | ORAL | Status: AC
  Administered 2017-01-20: 10 mg via ORAL
  Filled 2017-01-20: qty 1

## 2017-01-20 NOTE — OB Triage Note (Signed)
Patient admitted to triage after complaints of contractions that started around 2100 on 01/19/17. Denies any bleeding, LOF. Feels positive fetal movement.

## 2017-01-20 NOTE — OB Triage Note (Signed)
Reassessed patient's pain level. She stated that it varies, but it's much better than when she arrived and that she is able to rest through the contractions she is having now.   Patient resting comfortably in bed with SO at bedside. Denies the need for anything at this time.

## 2017-01-20 NOTE — Discharge Instructions (Signed)
Braxton Hicks Contractions °Contractions of the uterus can occur throughout pregnancy. Contractions are not always a sign that you are in labor.  °WHAT ARE BRAXTON HICKS CONTRACTIONS?  °Contractions that occur before labor are called Braxton Hicks contractions, or false labor. Toward the end of pregnancy (32-34 weeks), these contractions can develop more often and may become more forceful. This is not true labor because these contractions do not result in opening (dilatation) and thinning of the cervix. They are sometimes difficult to tell apart from true labor because these contractions can be forceful and people have different pain tolerances. You should not feel embarrassed if you go to the hospital with false labor. Sometimes, the only way to tell if you are in true labor is for your health care provider to look for changes in the cervix. °If there are no prenatal problems or other health problems associated with the pregnancy, it is completely safe to be sent home with false labor and await the onset of true labor. °HOW CAN YOU TELL THE DIFFERENCE BETWEEN TRUE AND FALSE LABOR? °False Labor  °· The contractions of false labor are usually shorter and not as hard as those of true labor.   °· The contractions are usually irregular.   °· The contractions are often felt in the front of the lower abdomen and in the groin.   °· The contractions may go away when you walk around or change positions while lying down.   °· The contractions get weaker and are shorter lasting as time goes on.   °· The contractions do not usually become progressively stronger, regular, and closer together as with true labor.   °True Labor  °· Contractions in true labor last 30-70 seconds, become very regular, usually become more intense, and increase in frequency.   °· The contractions do not go away with walking.   °· The discomfort is usually felt in the top of the uterus and spreads to the lower abdomen and low back.   °· True labor can be  determined by your health care provider with an exam. This will show that the cervix is dilating and getting thinner.   °WHAT TO REMEMBER °· Keep up with your usual exercises and follow other instructions given by your health care provider.   °· Take medicines as directed by your health care provider.   °· Keep your regular prenatal appointments.   °· Eat and drink lightly if you think you are going into labor.   °· If Braxton Hicks contractions are making you uncomfortable:   °¨ Change your position from lying down or resting to walking, or from walking to resting.   °¨ Sit and rest in a tub of warm water.   °¨ Drink 2-3 glasses of water. Dehydration may cause these contractions.   °¨ Do slow and deep breathing several times an hour.   °WHEN SHOULD I SEEK IMMEDIATE MEDICAL CARE? °Seek immediate medical care if: °· Your contractions become stronger, more regular, and closer together.   °· You have fluid leaking or gushing from your vagina.   °· You have a fever.   °· You pass blood-tinged mucus.   °· You have vaginal bleeding.   °· You have continuous abdominal pain.   °· You have low back pain that you never had before.   °· You feel your baby's head pushing down and causing pelvic pressure.   °· Your baby is not moving as much as it used to.   °This information is not intended to replace advice given to you by your health care provider. Make sure you discuss any questions you have with your health care   provider. °Document Released: 12/17/2005 Document Revised: 04/09/2016 Document Reviewed: 09/28/2013 °Elsevier Interactive Patient Education © 2017 [employer] ° °

## 2017-01-20 NOTE — Final Progress Note (Signed)
Physician Final Progress Note  Patient ID: Roberta Wagner MRN: [id number] DOB/AGE: 1981/01/18 36 y.o.  Admit date: 01/20/2017 Admitting provider: Nadara Mustardobert P Lyan Moyano, MD Discharge date: 01/20/2017   Admission Diagnoses: Pt presents G5P2022 AAF at [redacted] weeks EGA with regular uncomfortable contractions.  No nausea, vomiting, VB, ROM, fever, infection, trauma, sex, or other associated sx's or triggers.  Prior h/o PTL that was treated successfully with later term delivery. Prior CS for breech.  Discharge Diagnoses:  Active Problems:   History of cesarean section   Pregnancy   Preterm uterine contractions  Consults: None  Significant Findings/ Diagnostic Studies: none  Procedures: A NST procedure was performed with FHR monitoring and a normal baseline established, appropriate time of 20-40 minutes of evaluation, and accels >2 seen w 15x15 characteristics.  Results show a REACTIVE NST.   Discharge Condition: good    Plan outpatient monitoring as no cervical change over several hours (FT/TH/HIGH).    Pt responded to Procardia one dose with reduction of her feeling the contractions (able to sleep).    No other s/sx PTL or etiology to suggest future risk.  Disposition: 01-Home or Self Care  Diet: Regular diet  Discharge Activity: Activity as tolerated   Allergies as of 01/20/2017      Reactions   Sulfa Antibiotics Hives      Medication List    TAKE these medications   Doxylamine-Pyridoxine 10-10 MG Tbec Commonly known as:  DICLEGIS Take 10 mg by mouth as directed.   multivitamin-prenatal 27-0.8 MG Tabs tablet Take 1 tablet by mouth daily at 12 noon.   NIFEdipine 10 MG capsule Commonly known as:  PROCARDIA Take 1 capsule (10 mg total) by mouth 3 (three) times daily as needed.        Total time spent taking care of this patient: 20 minutes  Signed: Letitia Libraobert Paul Cato Liburd 01/20/2017, 8:10 AM

## 2017-01-20 NOTE — Discharge Summary (Signed)
  See FPN 

## 2017-01-22 ENCOUNTER — Encounter: Payer: Self-pay | Admitting: Obstetrics and Gynecology

## 2017-01-22 ENCOUNTER — Ambulatory Visit (INDEPENDENT_AMBULATORY_CARE_PROVIDER_SITE_OTHER): Payer: Medicaid Other | Admitting: Obstetrics and Gynecology

## 2017-01-22 DIAGNOSIS — O4700 False labor before 37 completed weeks of gestation, unspecified trimester: Secondary | ICD-10-CM

## 2017-01-22 DIAGNOSIS — Z3493 Encounter for supervision of normal pregnancy, unspecified, third trimester: Secondary | ICD-10-CM

## 2017-01-22 DIAGNOSIS — O479 False labor, unspecified: Secondary | ICD-10-CM

## 2017-01-22 LAB — POCT URINALYSIS DIPSTICK
BILIRUBIN UA: NEGATIVE
GLUCOSE UA: NEGATIVE
Ketones, UA: NEGATIVE
LEUKOCYTES UA: NEGATIVE
NITRITE UA: NEGATIVE
PH UA: 5
Protein, UA: NEGATIVE
RBC UA: NEGATIVE
Spec Grav, UA: 1.01
UROBILINOGEN UA: NEGATIVE

## 2017-01-22 NOTE — Progress Notes (Signed)
Patient seen in labor and delivery for preterm contractions at 33 weeks and 6 days. She was begun on 10 mg of Procardia 3 times a day. She states her contractions are not as strong as they were 2 days ago but she still has some irregular ones. She is hydrating.  Reports active fetal movement. Denies vaginal bleeding.  PLAN: Continue Procardia until 35 weeks. Signs and symptoms of preterm labor discussed. Patient to report this if her contractions become stronger. Patient still desires VBAC if possible.

## 2017-01-22 NOTE — Progress Notes (Signed)
Pt was seen at [facility] ED on   01/20/17. She was prescribed Procardia. States she felt better yesterday but today has started with contractions again.

## 2017-01-30 ENCOUNTER — Ambulatory Visit (INDEPENDENT_AMBULATORY_CARE_PROVIDER_SITE_OTHER): Payer: Medicaid Other | Admitting: Obstetrics and Gynecology

## 2017-01-30 VITALS — BP 114/57 | HR 93 | Wt 187.8 lb

## 2017-01-30 DIAGNOSIS — Z98891 History of uterine scar from previous surgery: Secondary | ICD-10-CM

## 2017-01-30 DIAGNOSIS — Z3483 Encounter for supervision of other normal pregnancy, third trimester: Secondary | ICD-10-CM

## 2017-01-30 LAB — POCT URINALYSIS DIPSTICK
Bilirubin, UA: NEGATIVE
Blood, UA: NEGATIVE
Glucose, UA: NEGATIVE
KETONES UA: NEGATIVE
Nitrite, UA: NEGATIVE
PH UA: 6.5
Spec Grav, UA: 1.025
Urobilinogen, UA: NEGATIVE

## 2017-01-30 NOTE — Progress Notes (Signed)
ROB: Patient still notes irritability but no strong contractions.  Given PTL precautions. RTC in 1 week, for 36 week labs.

## 2017-02-06 ENCOUNTER — Ambulatory Visit (INDEPENDENT_AMBULATORY_CARE_PROVIDER_SITE_OTHER): Payer: Medicaid Other | Admitting: Obstetrics and Gynecology

## 2017-02-06 DIAGNOSIS — Z3685 Encounter for antenatal screening for Streptococcus B: Secondary | ICD-10-CM

## 2017-02-06 DIAGNOSIS — Z113 Encounter for screening for infections with a predominantly sexual mode of transmission: Secondary | ICD-10-CM

## 2017-02-06 DIAGNOSIS — Z3483 Encounter for supervision of other normal pregnancy, third trimester: Secondary | ICD-10-CM

## 2017-02-06 LAB — POCT URINALYSIS DIPSTICK
BILIRUBIN UA: NEGATIVE
Blood, UA: NEGATIVE
GLUCOSE UA: NEGATIVE
Ketones, UA: NEGATIVE
NITRITE UA: NEGATIVE
Protein, UA: NEGATIVE
SPEC GRAV UA: 1.015
UROBILINOGEN UA: NEGATIVE
pH, UA: 7

## 2017-02-06 LAB — OB RESULTS CONSOLE GBS: STREP GROUP B AG: NEGATIVE

## 2017-02-09 LAB — GC/CHLAMYDIA PROBE AMP
CHLAMYDIA, DNA PROBE: NEGATIVE
NEISSERIA GONORRHOEAE BY PCR: NEGATIVE

## 2017-02-10 LAB — CULTURE, BETA STREP (GROUP B ONLY): STREP GP B CULTURE: NEGATIVE

## 2017-02-12 ENCOUNTER — Ambulatory Visit (INDEPENDENT_AMBULATORY_CARE_PROVIDER_SITE_OTHER): Payer: Medicaid Other | Admitting: Obstetrics and Gynecology

## 2017-02-12 VITALS — BP 114/64 | HR 90 | Wt 187.7 lb

## 2017-02-12 DIAGNOSIS — Z3483 Encounter for supervision of other normal pregnancy, third trimester: Secondary | ICD-10-CM

## 2017-02-12 DIAGNOSIS — Z98891 History of uterine scar from previous surgery: Secondary | ICD-10-CM

## 2017-02-12 LAB — POCT URINALYSIS DIPSTICK
Bilirubin, UA: NEGATIVE
Blood, UA: NEGATIVE
GLUCOSE UA: NEGATIVE
KETONES UA: NEGATIVE
Nitrite, UA: NEGATIVE
PROTEIN UA: NEGATIVE
Spec Grav, UA: 1.01
Urobilinogen, UA: NEGATIVE
pH, UA: 7.5

## 2017-02-12 NOTE — Progress Notes (Signed)
ROB: Patient doing well, no complaints.  

## 2017-02-19 ENCOUNTER — Ambulatory Visit (INDEPENDENT_AMBULATORY_CARE_PROVIDER_SITE_OTHER): Payer: Medicaid Other | Admitting: Obstetrics and Gynecology

## 2017-02-19 ENCOUNTER — Ambulatory Visit (INDEPENDENT_AMBULATORY_CARE_PROVIDER_SITE_OTHER): Payer: Medicaid Other

## 2017-02-19 VITALS — BP 105/59 | HR 94 | Wt 189.3 lb

## 2017-02-19 DIAGNOSIS — Z0374 Encounter for suspected problem with fetal growth ruled out: Secondary | ICD-10-CM

## 2017-02-19 DIAGNOSIS — Z3483 Encounter for supervision of other normal pregnancy, third trimester: Secondary | ICD-10-CM

## 2017-02-19 LAB — POCT URINALYSIS DIPSTICK
Bilirubin, UA: NEGATIVE
Blood, UA: NEGATIVE
GLUCOSE UA: NEGATIVE
Ketones, UA: NEGATIVE
LEUKOCYTES UA: NEGATIVE
Nitrite, UA: NEGATIVE
PROTEIN UA: NEGATIVE
UROBILINOGEN UA: 0.2
pH, UA: 7

## 2017-02-19 NOTE — Progress Notes (Signed)
ROB: Doing well, noted ctx q 5 min apart over the weekend which dissipated.  Reiterated labor precautions. Growth scan as no further growth noted by tape measurer (Has been 37 weeks for the past 3 weeks). RTC this afternoon for scan. RTC in 1 week.

## 2017-02-26 ENCOUNTER — Ambulatory Visit (INDEPENDENT_AMBULATORY_CARE_PROVIDER_SITE_OTHER): Payer: Medicaid Other | Admitting: Obstetrics and Gynecology

## 2017-02-26 VITALS — BP 113/61 | HR 92 | Wt 190.9 lb

## 2017-02-26 DIAGNOSIS — Z3483 Encounter for supervision of other normal pregnancy, third trimester: Secondary | ICD-10-CM

## 2017-02-26 DIAGNOSIS — Z98891 History of uterine scar from previous surgery: Secondary | ICD-10-CM

## 2017-02-26 LAB — POCT URINALYSIS DIPSTICK
BILIRUBIN UA: NEGATIVE
Glucose, UA: NEGATIVE
KETONES UA: NEGATIVE
Nitrite, UA: NEGATIVE
SPEC GRAV UA: 1.015
UROBILINOGEN UA: NEGATIVE
pH, UA: 7

## 2017-02-26 NOTE — Progress Notes (Addendum)
ROB: Patient c/o of irregular ctx, were strong 2 days ago and lasted all day until 7 pm, coming every 5 minutes.  Notes that she is "very uncomfortable".  Reiterated labor precautions. RTC in 1 week if undelivered. Growth scan from last week normal, normal AFI.

## 2017-02-27 NOTE — Addendum Note (Signed)
Addended by: Fabian NovemberHERRY, Jalilah Wiltsie S on: 02/27/2017 08:17 AM   Modules accepted: Orders, SmartSet

## 2017-03-05 ENCOUNTER — Inpatient Hospital Stay
Admission: EM | Admit: 2017-03-05 | Discharge: 2017-03-07 | DRG: 775 | Disposition: A | Discharge status: Home / Self Care | Payer: Medicaid Other | Source: Other Acute Inpatient Hospital | Attending: Obstetrics and Gynecology | Admitting: Obstetrics and Gynecology

## 2017-03-05 ENCOUNTER — Ambulatory Visit (INDEPENDENT_AMBULATORY_CARE_PROVIDER_SITE_OTHER): Payer: Medicaid Other | Admitting: Obstetrics and Gynecology

## 2017-03-05 ENCOUNTER — Inpatient Hospital Stay: Payer: Medicaid Other | Admitting: Anesthesiology

## 2017-03-05 VITALS — BP 104/63 | HR 102 | Wt 193.0 lb

## 2017-03-05 DIAGNOSIS — O9962 Diseases of the digestive system complicating childbirth: Secondary | ICD-10-CM | POA: Diagnosis present

## 2017-03-05 DIAGNOSIS — K529 Noninfective gastroenteritis and colitis, unspecified: Secondary | ICD-10-CM | POA: Diagnosis present

## 2017-03-05 DIAGNOSIS — Z3A40 40 weeks gestation of pregnancy: Secondary | ICD-10-CM

## 2017-03-05 DIAGNOSIS — Z3483 Encounter for supervision of other normal pregnancy, third trimester: Secondary | ICD-10-CM

## 2017-03-05 DIAGNOSIS — O4202 Full-term premature rupture of membranes, onset of labor within 24 hours of rupture: Secondary | ICD-10-CM | POA: Diagnosis present

## 2017-03-05 DIAGNOSIS — O4292 Full-term premature rupture of membranes, unspecified as to length of time between rupture and onset of labor: Secondary | ICD-10-CM

## 2017-03-05 DIAGNOSIS — O480 Post-term pregnancy: Secondary | ICD-10-CM | POA: Diagnosis present

## 2017-03-05 DIAGNOSIS — O34211 Maternal care for low transverse scar from previous cesarean delivery: Secondary | ICD-10-CM | POA: Diagnosis present

## 2017-03-05 LAB — CBC
HEMATOCRIT: 35.4 % (ref 35.0–47.0)
HEMOGLOBIN: 12 g/dL (ref 12.0–16.0)
MCH: 27.4 pg (ref 26.0–34.0)
MCHC: 33.9 g/dL (ref 32.0–36.0)
MCV: 80.8 fL (ref 80.0–100.0)
Platelets: 270 10*3/uL (ref 150–440)
RBC: 4.38 MIL/uL (ref 3.80–5.20)
RDW: 13.7 % (ref 11.5–14.5)
WBC: 8.6 10*3/uL (ref 3.6–11.0)

## 2017-03-05 LAB — POCT URINALYSIS DIPSTICK
BILIRUBIN UA: NEGATIVE
Glucose, UA: NEGATIVE
KETONES UA: NEGATIVE
Nitrite, UA: NEGATIVE
PH UA: 7.5
PROTEIN UA: NEGATIVE
RBC UA: NEGATIVE
SPEC GRAV UA: 1.01
Urobilinogen, UA: NEGATIVE

## 2017-03-05 LAB — TYPE AND SCREEN
ABO/RH(D): O POS
ANTIBODY SCREEN: NEGATIVE

## 2017-03-05 MED ORDER — LIDOCAINE HCL (PF) 1 % IJ SOLN
30.0000 mL | INTRAMUSCULAR | Status: AC | PRN
  Administered 2017-03-05: 4 mL via SUBCUTANEOUS
  Filled 2017-03-05: qty 30

## 2017-03-05 MED ORDER — OXYCODONE-ACETAMINOPHEN 5-325 MG PO TABS: 1.0000 | ORAL_TABLET | ORAL | Status: DC | PRN

## 2017-03-05 MED ORDER — MISOPROSTOL 200 MCG PO TABS
ORAL_TABLET | ORAL | Status: AC
  Filled 2017-03-05: qty 4

## 2017-03-05 MED ORDER — BUPIVACAINE HCL (PF) 0.25 % IJ SOLN
INTRAMUSCULAR | Status: DC | PRN
  Administered 2017-03-05 (×2): 4 mL via EPIDURAL

## 2017-03-05 MED ORDER — OXYCODONE-ACETAMINOPHEN 5-325 MG PO TABS: 2.0000 | ORAL_TABLET | ORAL | Status: DC | PRN

## 2017-03-05 MED ORDER — LIDOCAINE-EPINEPHRINE (PF) 1.5 %-1:200000 IJ SOLN
INTRAMUSCULAR | Status: DC | PRN
  Administered 2017-03-05: 4 mL via EPIDURAL

## 2017-03-05 MED ORDER — BUTORPHANOL TARTRATE 1 MG/ML IJ SOLN
1.0000 mg | INTRAMUSCULAR | Status: DC | PRN
  Administered 2017-03-05: 1 mg via INTRAVENOUS
  Filled 2017-03-05: qty 1

## 2017-03-05 MED ORDER — SOD CITRATE-CITRIC ACID 500-334 MG/5ML PO SOLN
30.0000 mL | ORAL | Status: DC | PRN
  Filled 2017-03-05: qty 30

## 2017-03-05 MED ORDER — FENTANYL 2.5 MCG/ML W/ROPIVACAINE 0.2% IN NS 100 ML EPIDURAL INFUSION (ARMC-ANES)
EPIDURAL | Status: AC
  Filled 2017-03-05: qty 100

## 2017-03-05 MED ORDER — LACTATED RINGERS IV SOLN
INTRAVENOUS | Status: DC
  Administered 2017-03-05: 15:00:00 via INTRAVENOUS

## 2017-03-05 MED ORDER — ONDANSETRON HCL 4 MG/2ML IJ SOLN: 4.0000 mg | Freq: Four times a day (QID) | INTRAMUSCULAR | Status: DC | PRN

## 2017-03-05 MED ORDER — OXYTOCIN 40 UNITS IN LACTATED RINGERS INFUSION - SIMPLE MED
2.5000 [IU]/h | INTRAVENOUS | Status: DC
Start: 2017-03-05 — End: 2017-03-06
  Filled 2017-03-05: qty 1000

## 2017-03-05 MED ORDER — OXYTOCIN BOLUS FROM INFUSION
500.0000 mL | Freq: Once | INTRAVENOUS | Status: AC
  Administered 2017-03-05: 500 mL via INTRAVENOUS

## 2017-03-05 MED ORDER — FENTANYL 2.5 MCG/ML W/ROPIVACAINE 0.2% IN NS 100 ML EPIDURAL INFUSION (ARMC-ANES)
EPIDURAL | Status: DC | PRN
  Administered 2017-03-05: 10 mL/h via EPIDURAL

## 2017-03-05 MED ORDER — ACETAMINOPHEN 325 MG PO TABS: 650.0000 mg | ORAL_TABLET | ORAL | Status: DC | PRN

## 2017-03-05 MED ORDER — LACTATED RINGERS IV SOLN: 500.0000 mL | INTRAVENOUS | Status: DC | PRN

## 2017-03-05 NOTE — Anesthesia Preprocedure Evaluation (Signed)
Anesthesia Evaluation  Patient identified by MRN, date of birth, ID band Patient awake    Reviewed: Allergy & Precautions, H&P , NPO status , Patient's Chart, lab work & pertinent test results, reviewed documented beta blocker date and time   Airway Mallampati: II  TM Distance: >3 FB Neck ROM: full    Dental no notable dental hx. (+) Teeth Intact   Pulmonary neg pulmonary ROS, Current Smoker,    Pulmonary exam normal breath sounds clear to auscultation       Cardiovascular Exercise Tolerance: Good negative cardio ROS   Rhythm:regular Rate:Normal     Neuro/Psych negative neurological ROS  negative psych ROS   GI/Hepatic negative GI ROS, Neg liver ROS,   Endo/Other  negative endocrine ROSdiabetes  Renal/GU      Musculoskeletal   Abdominal   Peds  Hematology negative hematology ROS (+)   Anesthesia Other Findings   Reproductive/Obstetrics (+) Pregnancy                             Anesthesia Physical Anesthesia Plan  ASA: II  Anesthesia Plan: Epidural   Post-op Pain Management:    Induction:   Airway Management Planned:   Additional Equipment:   Intra-op Plan:   Post-operative Plan:   Informed Consent: I have reviewed the patients History and Physical, chart, labs and discussed the procedure including the risks, benefits and alternatives for the proposed anesthesia with the patient or authorized representative who has indicated his/her understanding and acceptance.     Plan Discussed with:   Anesthesia Plan Comments:         Anesthesia Quick Evaluation  

## 2017-03-05 NOTE — H&P (Signed)
Obstetric History and Physical  Roberta Wagner is a 36 y.o. [id number] with IUP at [redacted]w[redacted]d presenting for PROM in office this morning. Patient states she has been having  irregular contractions, minimal vaginal bleeding, ruptured, clear fluid membranes, with active fetal movement.  Of note, patient is a prior C-section x 1, desiring TOLAC.   Prenatal Course Source of Care: [facility]  with onset of care at 10 weeks Pregnancy complications or risks: Patient Active Problem List   Diagnosis Date Noted  . Labor and delivery indication for care or intervention 03/05/2017  . Pregnancy 01/06/2017  . Gastroenteritis 01/06/2017  . Preterm uterine contractions 12/27/2016  . Pap smear abnormality of cervix with ASCUS favoring benign 08/17/2016  . Amenorrhea 07/10/2016  . History of cesarean section 07/10/2016   She plans to breastfeed She desires oral contraceptives (estrogen/progesterone) for postpartum contraception.   Prenatal labs and studies: ABO, Rh: --/--/O POS (03/06 1519) Antibody: NEG (03/06 1519) Rubella: 12.60 (08/01 1411) RPR: Non Reactive (08/01 1411)  HBsAg: Negative (08/01 1411)  HIV: Non Reactive (08/01 1411)  UXL:KGMWNUUV (02/07 0000) 1 hr Glucola  normal Genetic screening normal Anatomy US normal   Past Medical History:  Diagnosis Date  . Abnormal Pap smear of vagina 2012   colpo neg  . Kidney stone     Past Surgical History:  Procedure Laterality Date  . CESAREAN SECTION  2014  . kidney stone remove  2016    OB History  Gravida Para Term Preterm AB Living  5 2 2   2 2   SAB TAB Ectopic Multiple Live Births  1       2    # Outcome Date GA Lbr Len/2nd Weight Sex Delivery Anes PTL Lv  5 Current           4 SAB 2016          3 AB 2014          2 Term 2014 [redacted]w[redacted]d  7 lb 1.8 oz (3.225 kg) F CS-LTranv   LIV     Complications: Breech birth  1 Term 2012 [redacted]w[redacted]d  7 lb 9.6 oz (3.447 kg) F Vag-Spont   LIV      Social History   Social History  .  Marital status: Married    Spouse name: N/A  . Number of children: N/A  . Years of education: N/A   Social History Main Topics  . Smoking status: Never Smoker  . Smokeless tobacco: Never Used  . Alcohol use No  . Drug use: No  . Sexual activity: Yes    Birth control/ protection: None     Comment: Pregnant    Other Topics Concern  . None   Social History Narrative  . None    Family History  Problem Relation Age of Onset  . Breast cancer Neg Hx   . Cancer Neg Hx   . Diabetes Neg Hx   . Heart disease Neg Hx     Prescriptions Prior to Admission  Medication Sig Dispense Refill Last Dose  . Doxylamine-Pyridoxine (DICLEGIS) 10-10 MG TBEC Take 10 mg by mouth as directed. (Patient not taking: Reported on 02/19/2017) 120 tablet 1 Not Taking  . NIFEdipine (PROCARDIA) 10 MG capsule Take 1 capsule (10 mg total) by mouth 3 (three) times daily as needed. (Patient not taking: Reported on 02/19/2017) 90 capsule 0 Not Taking  . Prenatal Vit-Fe Fumarate-FA (MULTIVITAMIN-PRENATAL) 27-0.8 MG TABS tablet Take 1 tablet by mouth daily at 12 noon.  Taking    Allergies  Allergen Reactions  . Sulfa Antibiotics Hives    Review of Systems: Negative except for what is mentioned in HPI.  Physical Exam: BP 118/63 (BP Location: Left Arm)   Pulse 92   Temp 98.3 F (36.8 C) (Oral)   Resp 18   Ht 5\' 9"  (1.753 m)   Wt 193 lb (87.5 kg)   LMP 05/28/2016 (Exact Date)   BMI 28.50 kg/m  CONSTITUTIONAL: Well-developed, well-nourished female in no acute distress.  HENT:  Normocephalic, atraumatic, External right and left ear normal. Oropharynx is clear and moist EYES: Conjunctivae and EOM are normal. Pupils are equal, round, and reactive to light. No scleral icterus.  NECK: Normal range of motion, supple, no masses SKIN: Skin is warm and dry. No rash noted. Not diaphoretic. No erythema. No pallor. NEUROLOGIC: Alert and oriented to person, place, and time. Normal reflexes, muscle tone coordination. No  cranial nerve deficit noted. PSYCHIATRIC: Normal mood and affect. Normal behavior. Normal judgment and thought content. CARDIOVASCULAR: Normal heart rate noted, regular rhythm RESPIRATORY: Effort and breath sounds normal, no problems with respiration noted ABDOMEN: Soft, nontender, nondistended, gravid. MUSCULOSKELETAL: Normal range of motion. No edema and no tenderness. 2+ distal pulses.  Cervical Exam: Dilatation 2 cm   Effacement 50%   Station -3 to -2   Presentation: cephalic FHT:  Baseline rate 155 bpm   Variability moderate  Accelerations present   Decelerations none Contractions: Every 3-4 mins   Pertinent Labs/Studies:   Results for orders placed or performed during the hospital encounter of 03/05/17 (from the past 24 hour(s))  Type and screen     Status: None   Collection Time: 03/05/17  3:19 PM  Result Value Ref Range   ABO/RH(D) O POS    Antibody Screen NEG    Sample Expiration 03/08/2017   CBC     Status: None   Collection Time: 03/05/17  3:20 PM  Result Value Ref Range   WBC 8.6 3.6 - 11.0 K/uL   RBC 4.38 3.80 - 5.20 MIL/uL   Hemoglobin 12.0 12.0 - 16.0 g/dL   HCT 09.835.4 11.935.0 - 14.747.0 %   MCV 80.8 80.0 - 100.0 fL   MCH 27.4 26.0 - 34.0 pg   MCHC 33.9 32.0 - 36.0 g/dL   RDW 82.913.7 56.211.5 - 13.014.5 %   Platelets 270 150 - 440 K/uL    Assessment : Roberta Wagner is a 36 y.o. [id number] at 8192w1d being admitted for PROM, now with signs of early labor.  Prior C-section x 1 desiring TOLAC.  Plan: Labor: Expectant management.  Augmentation as needed, per protocol.  FWB: Reassuring fetal heart tracing.  GBS negative Delivery plan: Hopeful for vaginal delivery.    Hildred LaserAnika Irish Breisch, MD [facility]

## 2017-03-05 NOTE — Anesthesia Procedure Notes (Signed)
Epidural Patient location during procedure: OB  Staffing Performed: anesthesiologist   Preanesthetic Checklist Completed: patient identified, site marked, surgical consent, pre-op evaluation, timeout performed, IV checked, risks and benefits discussed and monitors and equipment checked  Epidural Patient position: sitting Prep: Betadine Patient monitoring: heart rate, continuous pulse ox and blood pressure Approach: midline Location: L4-L5 Injection technique: LOR saline  Needle:  Needle type: Tuohy  Needle gauge: 17 G Needle length: 9 cm and 9 Needle insertion depth: 5 cm Catheter type: closed end flexible Catheter size: 19 Gauge Catheter at skin depth: 12 cm Test dose: negative and 1.5% lidocaine with Epi 1:200 K  Assessment Sensory level: T10 Events: blood not aspirated, injection not painful, no injection resistance, negative IV test and no paresthesia  Additional Notes   Patient tolerated the insertion well without complications.-SATD -IVTD. No paresthesia. Refer to [facility] nursing for VS and dosingReason for block:procedure for pain     

## 2017-03-05 NOTE — Progress Notes (Signed)
ROB: Patient doing well, still noting irregular ctx.  SROM on cervical check today. Will send to L&D.

## 2017-03-06 ENCOUNTER — Encounter: Payer: Self-pay | Admitting: Student

## 2017-03-06 LAB — CBC
HCT: 34.5 % — ABNORMAL LOW (ref 35.0–47.0)
Hemoglobin: 11.6 g/dL — ABNORMAL LOW (ref 12.0–16.0)
MCH: 26.8 pg (ref 26.0–34.0)
MCHC: 33.5 g/dL (ref 32.0–36.0)
MCV: 80 fL (ref 80.0–100.0)
Platelets: 273 10*3/uL (ref 150–440)
RBC: 4.32 MIL/uL (ref 3.80–5.20)
RDW: 14 % (ref 11.5–14.5)
WBC: 20.2 10*3/uL — AB (ref 3.6–11.0)

## 2017-03-06 LAB — RPR: RPR: NONREACTIVE

## 2017-03-06 MED ORDER — LACTATED RINGERS IV SOLN: INTRAVENOUS | Status: DC

## 2017-03-06 MED ORDER — DIBUCAINE 1 % RE OINT: 1.0000 "application " | TOPICAL_OINTMENT | RECTAL | Status: DC | PRN

## 2017-03-06 MED ORDER — SIMETHICONE 80 MG PO CHEW: 80.0000 mg | CHEWABLE_TABLET | ORAL | Status: DC | PRN

## 2017-03-06 MED ORDER — SENNOSIDES-DOCUSATE SODIUM 8.6-50 MG PO TABS
2.0000 | ORAL_TABLET | ORAL | Status: DC
  Administered 2017-03-07: 2 via ORAL
  Filled 2017-03-06: qty 2

## 2017-03-06 MED ORDER — IBUPROFEN 600 MG PO TABS
600.0000 mg | ORAL_TABLET | Freq: Four times a day (QID) | ORAL | Status: DC
  Administered 2017-03-06 – 2017-03-07 (×4): 600 mg via ORAL
  Filled 2017-03-06 (×4): qty 1

## 2017-03-06 MED ORDER — EPHEDRINE 5 MG/ML INJ
10.0000 mg | INTRAVENOUS | Status: DC | PRN
Start: 2017-03-06 — End: 2017-03-06

## 2017-03-06 MED ORDER — EPHEDRINE 5 MG/ML INJ: 10.0000 mg | INTRAVENOUS | Status: DC | PRN

## 2017-03-06 MED ORDER — HYDROCODONE-ACETAMINOPHEN 5-325 MG PO TABS
1.0000 | ORAL_TABLET | ORAL | Status: DC | PRN
Start: 2017-03-06 — End: 2017-03-07

## 2017-03-06 MED ORDER — BENZOCAINE-MENTHOL 20-0.5 % EX AERO: 1.0000 "application " | INHALATION_SPRAY | CUTANEOUS | Status: DC | PRN

## 2017-03-06 MED ORDER — LACTATED RINGERS IV SOLN: 500.0000 mL | Freq: Once | INTRAVENOUS | Status: DC

## 2017-03-06 MED ORDER — DIPHENHYDRAMINE HCL 50 MG/ML IJ SOLN: 12.5000 mg | INTRAMUSCULAR | Status: DC | PRN

## 2017-03-06 MED ORDER — PRENATAL MULTIVITAMIN CH
1.0000 | ORAL_TABLET | Freq: Every day | ORAL | Status: DC
  Administered 2017-03-06: 1 via ORAL
  Filled 2017-03-06 (×2): qty 1

## 2017-03-06 MED ORDER — ONDANSETRON HCL 4 MG PO TABS: 4.0000 mg | ORAL_TABLET | ORAL | Status: DC | PRN

## 2017-03-06 MED ORDER — ZOLPIDEM TARTRATE 5 MG PO TABS: 5.0000 mg | ORAL_TABLET | Freq: Every evening | ORAL | Status: DC | PRN

## 2017-03-06 MED ORDER — COCONUT OIL OIL
1.0000 "application " | TOPICAL_OIL | Status: DC | PRN
  Administered 2017-03-07: 1 via TOPICAL
  Filled 2017-03-06: qty 120

## 2017-03-06 MED ORDER — ONDANSETRON HCL 4 MG/2ML IJ SOLN: 4.0000 mg | INTRAMUSCULAR | Status: DC | PRN

## 2017-03-06 MED ORDER — TETANUS-DIPHTH-ACELL PERTUSSIS 5-2.5-18.5 LF-MCG/0.5 IM SUSP: 0.5000 mL | Freq: Once | INTRAMUSCULAR | Status: DC

## 2017-03-06 MED ORDER — ACETAMINOPHEN 325 MG PO TABS: 650.0000 mg | ORAL_TABLET | ORAL | Status: DC | PRN

## 2017-03-06 MED ORDER — WITCH HAZEL-GLYCERIN EX PADS: 1.0000 "application " | MEDICATED_PAD | CUTANEOUS | Status: DC | PRN

## 2017-03-06 MED ORDER — PHENYLEPHRINE 40 MCG/ML (10ML) SYRINGE FOR IV PUSH (FOR BLOOD PRESSURE SUPPORT): 80.0000 ug | PREFILLED_SYRINGE | INTRAVENOUS | Status: DC | PRN

## 2017-03-06 MED ORDER — DIPHENHYDRAMINE HCL 25 MG PO CAPS: 25.0000 mg | ORAL_CAPSULE | Freq: Four times a day (QID) | ORAL | Status: DC | PRN

## 2017-03-06 MED ORDER — FENTANYL 2.5 MCG/ML W/ROPIVACAINE 0.2% IN NS 100 ML EPIDURAL INFUSION (ARMC-ANES): 10.0000 mL/h | EPIDURAL | Status: DC

## 2017-03-06 NOTE — Anesthesia Postprocedure Evaluation (Signed)
Anesthesia Post Note  Patient: Roberta Wagner  Procedure(s) Performed: * No procedures listed *  Patient location during evaluation: Mother Baby Anesthesia Type: Epidural Level of consciousness: awake, awake and alert and oriented Pain management: pain level controlled Vital Signs Assessment: post-procedure vital signs reviewed and stable Respiratory status: spontaneous breathing Cardiovascular status: blood pressure returned to baseline Postop Assessment: no headache, no backache, no signs of nausea or vomiting and adequate PO intake Anesthetic complications: no     Last Vitals:  Vitals:   03/06/17 0130 03/06/17 0408  BP: (!) 129/59 129/65  Pulse: 85 86  Resp: 16 16  Temp: 37 C 36.8 C    Last Pain:  Vitals:   03/06/17 0408  TempSrc: Oral  PainSc:                  Karoline Caldwelleana Kaya Pottenger

## 2017-03-06 NOTE — Progress Notes (Signed)
Post Partum Day # 1, s/p SVD  Subjective: no complaints, up ad lib, voiding and tolerating PO  Objective: Temp:  [97.5 F (36.4 C)-98.6 F (37 C)] 98.6 F (37 C) (03/07 0735) Pulse Rate:  [70-104] 91 (03/07 0735) Resp:  [16-18] 18 (03/07 0735) BP: (94-129)/(39-82) 119/65 (03/07 0735) SpO2:  [97 %-100 %] 100 % (03/07 0735) Weight:  [193 lb (87.5 kg)] 193 lb (87.5 kg) (03/06 1431)  Physical Exam:  General: alert and no distress  Lungs: clear to auscultation bilaterally Breasts: normal appearance, no masses or tenderness Heart: regular rate and rhythm, S1, S2 normal, no murmur, click, rub or gallop  Abdomen: Soft, non-tender, bowel sounds present.  Pelvis: Lochia: appropriate, Uterine Fundus: firm Extremities: DVT Evaluation: No evidence of DVT seen on physical exam. Negative Homan's sign. No cords or calf tenderness. No significant calf/ankle edema.   Recent Labs  03/05/17 1520 03/06/17 0600  HGB 12.0 11.6*  HCT 35.4 34.5*    Assessment/Plan: Breastfeeding Circumcision after discharge  Contraception: considering IUD vs Nexplanon now.  Will give handouts on both at discharge Continue routine postpartum care Plan for discharge tomorrow   LOS: 1 day   Hildred LaserAnika Faolan [place], MD [facility] 03/06/2017 7:58 AM

## 2017-03-07 ENCOUNTER — Encounter: Payer: Self-pay | Admitting: Obstetrics and Gynecology

## 2017-03-07 NOTE — Discharge Instructions (Signed)
Please call your doctor or return to the ER if you experience any chest pains, dizziness, shortness of breath, fever greater than 101, any heavy bleeding (saturating more than 1 pad per hour), large clots, or foul smelling discharge, any worsening abdominal pain and cramping that is not controlled by pain medication, or any signs of postpartum depression. No tampons, enemas, douches, or sexual intercourse for 6 weeks. Do not lift anything >10 lbs for 6 weeks.  General Postpartum Discharge Instructions  Do not drink alcohol or take tranquilizers.  Do not take medicine that has not been prescribed by your doctor.  Take showers instead of baths until your doctor gives you permission to take baths.  No sexual intercourse or placement of anything in the vagina for 6 weeks or as instructed by your doctor. Only take prescription or over-the-counter medicines  for pain, discomfort, or fever as directed by your doctor. Take medicines (antibiotics) that kill germs if they are prescribed for you.   Call the office or go to the Emergency Room if:  You feel sick to your stomach (nauseous).  You start to throw up (vomit).  You have trouble eating or drinking.  You have an oral temperature above 101.  You have constipation that is not helped by adjusting diet or increasing fluid intake. Pain medicines are a common cause of constipation.  You have foul smelling vaginal discharge or odor.  You have bleeding requiring changing more than 1 pad per hour. You have any other concerns.  SEEK IMMEDIATE MEDICAL CARE IF:  You have persistent dizziness.  You have difficulty breathing or shortness of breath.  You have an oral temperature above 102.5, not controlled by medicine.

## 2017-03-07 NOTE — Progress Notes (Signed)
Discharge order received from doctor. Reviewed discharge instructions and prescriptions with patient and answered all questions. Follow up appointment instructions given. Patient verbalized understanding. ID bands checked. Patient discharged home with infant via wheelchair by nursing/auxillary.    Ahmia Colford Garner, RN  

## 2017-03-07 NOTE — Discharge Summary (Signed)
Obstetric Discharge Summary Reason for Admission: rupture of membranes Prenatal Procedures: NST and ultrasound Intrapartum Procedures: spontaneous vaginal delivery (VBAC) Postpartum Procedures: none Complications-Operative and Postpartum: none Hemoglobin  Date Value Ref Range Status  03/06/2017 11.6 (L) 12.0 - 16.0 g/dL Final   HCT  Date Value Ref Range Status  03/06/2017 34.5 (L) 35.0 - 47.0 % Final   Hematocrit  Date Value Ref Range Status  12/13/2016 34.0 34.0 - 46.6 % Final    Physical Exam:  Vitals:   03/06/17 1642 03/06/17 1930 03/07/17 0748 03/07/17 1100  BP: (!) 109/56 123/64 117/62   Pulse: 81 72 68   Resp: 16 17 18    Temp: 98.1 F (36.7 C) 98.7 F (37.1 C) 98.4 F (36.9 C) 98.6 F (37 C)  TempSrc: Oral Oral Oral Oral  SpO2: 100% 100% 100%   Weight:      Height:       General: alert and no distress Lochia: appropriate Uterine Fundus: firm Incision: None DVT Evaluation: No evidence of DVT seen on physical exam. Negative Homan's sign. No cords or calf tenderness. No significant calf/ankle edema.  Discharge Diagnoses: Post-date pregnancy - delivered after PROM, successful VBAC.  Discharge Information: Date: 03/07/2017 Activity: pelvic rest Diet: routine Medications: PNV and Ibuprofen Condition: stable Instructions: refer to practice specific booklet Discharge to: home Follow-up Information    Roberta LaserAnika Ignacia Gentzler, MD. Schedule an appointment as soon as possible for a visit in 5 week(s).   Specialties:  Obstetrics and Gynecology, Radiology Why:  Please call to make a postpartum follow up appointment at:  5 weeks for postpartum check 6 weeks for IUD insertion Contact information: [address] [place] [id number] [phone]           Newborn Data: Live born female  Birth Weight: 7 lb 9.7 oz (3450 g) APGAR: 8, 9  Home with mother.  Roberta Lasernika Shyne Wagner 03/07/2017, 12:09 PM

## 2017-04-16 ENCOUNTER — Ambulatory Visit (INDEPENDENT_AMBULATORY_CARE_PROVIDER_SITE_OTHER): Payer: Medicaid Other | Admitting: Obstetrics and Gynecology

## 2017-04-16 ENCOUNTER — Encounter: Payer: Self-pay | Admitting: Obstetrics and Gynecology

## 2017-04-16 DIAGNOSIS — Z3009 Encounter for other general counseling and advice on contraception: Secondary | ICD-10-CM

## 2017-04-16 NOTE — Progress Notes (Signed)
HPI:      Roberta Wagner is a 36 y.o. [phone] who LMP was No LMP recorded.  Subjective:   She presents today Approximately 6 weeks postpartum. She had a VBAC. She is considering forms of birth control and would like to discuss them today. She is breast-feeding full-time. She has specifically requested something with low hormone levels- "possibly an implant."    Hx: The following portions of the patient's history were reviewed and updated as appropriate:              She  has a past medical history of Abnormal Pap smear of vagina (2012) and Kidney stone. She  does not have any pertinent problems on file. She  has a past surgical history that includes Cesarean section (2014) and kidney stone remove (2016). Her family history is not on file. She  reports that she has never smoked. She has never used smokeless tobacco. She reports that she does not drink alcohol or use drugs. Current Outpatient Prescriptions on File Prior to Visit  Medication Sig Dispense Refill  . Prenatal Vit-Fe Fumarate-FA (MULTIVITAMIN-PRENATAL) 27-0.8 MG TABS tablet Take 1 tablet by mouth daily at 12 noon.     No current facility-administered medications on file prior to visit.          Review of Systems:  Review of Systems  Constitutional: Denied constitutional symptoms, night sweats, recent illness, fatigue, fever, insomnia and weight loss.  Eyes: Denied eye symptoms, eye pain, photophobia, vision change and visual disturbance.  Ears/Nose/Throat/Neck: Denied ear, nose, throat or neck symptoms, hearing loss, nasal discharge, sinus congestion and sore throat.  Cardiovascular: Denied cardiovascular symptoms, arrhythmia, chest pain/pressure, edema, exercise intolerance, orthopnea and palpitations.  Respiratory: Denied pulmonary symptoms, asthma, pleuritic pain, productive sputum, cough, dyspnea and wheezing.  Gastrointestinal: Denied, gastro-esophageal reflux, melena, nausea and vomiting.  Genitourinary: Denied  genitourinary symptoms including symptomatic vaginal discharge, pelvic relaxation issues, and urinary complaints.  Musculoskeletal: Denied musculoskeletal symptoms, stiffness, swelling, muscle weakness and myalgia.  Dermatologic: Denied dermatology symptoms, rash and scar.  Neurologic: Denied neurology symptoms, dizziness, headache, neck pain and syncope.  Psychiatric: Denied psychiatric symptoms, anxiety and depression.  Endocrine: Denied endocrine symptoms including hot flashes and night sweats.   Meds:   Current Outpatient Prescriptions on File Prior to Visit  Medication Sig Dispense Refill  . Prenatal Vit-Fe Fumarate-FA (MULTIVITAMIN-PRENATAL) 27-0.8 MG TABS tablet Take 1 tablet by mouth daily at 12 noon.     No current facility-administered medications on file prior to visit.     Objective:     Vitals:   04/16/17 1105  BP: (!) 119/58  Pulse: 73              Pelvic examination   Pelvic:   Vulva: Normal appearance.  No lesions.  No abnormal scarring.    Vagina: No lesions or abnormalities noted.  Support: Normal pelvic support.  Urethra No masses tenderness or scarring.  Meatus Normal size without lesions or prolapse.  Cervix: Normal ectropion.  No lesions.  Anus: Normal exam.  No lesions.  Perineum: Normal exam.  No lesions.  Healed well.          Bimanual   Uterus: Normal size.  Non-tender.  Mobile.  AV.  Adnexae: No masses.  Non-tender to palpation.  Cul-de-sac: Negative for abnormality.     Assessment:    [id number] Patient Active Problem List   Diagnosis Date Noted  . Labor and delivery indication for care or intervention 03/05/2017  . Pregnancy  01/06/2017  . Gastroenteritis 01/06/2017  . Preterm uterine contractions 12/27/2016  . Pap smear abnormality of cervix with ASCUS favoring benign 08/17/2016  . Amenorrhea 07/10/2016  . History of cesarean section 07/10/2016     1. Postpartum care and examination immediately after delivery   2. Birth control  counseling      Patient doing very well postpartum.  Plan:            1.  Birth Control I discussed multiple birth control options and methods with the patient.  The risks and benefits of each were reviewed. IUD Literature on [place] given.  Risks and benefits of each discussed.  She is considering IUD as an option for birth/cycle control. Patient has chosen IUD for birth control. She will call [place] with her next menstrual period. If she does not have one in the next 2 weeks we will perform a pregnancy test after abstaining from intercourse for 10 days.          F/U  Return in about 13 days (around 04/29/2017) for She is to call at the start of next menses.  Elonda Husky, M.D. 04/16/2017 11:48 AM

## 2017-04-23 ENCOUNTER — Ambulatory Visit (INDEPENDENT_AMBULATORY_CARE_PROVIDER_SITE_OTHER): Payer: Medicaid Other | Admitting: Obstetrics and Gynecology

## 2017-04-23 ENCOUNTER — Encounter: Payer: Self-pay | Admitting: Obstetrics and Gynecology

## 2017-04-23 VITALS — BP 119/55 | HR 76 | Ht 69.0 in | Wt 171.0 lb

## 2017-04-23 DIAGNOSIS — Z30430 Encounter for insertion of intrauterine contraceptive device: Secondary | ICD-10-CM | POA: Diagnosis not present

## 2017-04-23 LAB — POCT URINE PREGNANCY: Preg Test, Ur: NEGATIVE

## 2017-04-23 NOTE — Progress Notes (Signed)
     GYNECOLOGY OFFICE PROCEDURE NOTE  Roberta Wagner is a 36 y.o. [id number] here for Mirena IUD insertion. No GYN concerns.  Last pap smear was on 08/14/2016 and was ASCUS HR HPV negative.  IUD Insertion Procedure Note Patient identified, informed consent performed, consent signed.   Discussed risks of irregular bleeding, cramping, infection, malpositioning or misplacement of the IUD outside the uterus which may require further procedure such as laparoscopy. Time out was performed.  Urine pregnancy test negative.  Speculum placed in the vagina.  Cervix visualized.  Cleaned with Betadine x 2.  Grasped anteriorly with a single tooth tenaculum.  Uterus sounded to 7 cm.  Mirena IUD placed per manufacturer's recommendations.  Strings trimmed to 3 cm. Tenaculum was removed, good hemostasis noted.  Patient tolerated procedure well.   Patient was given post-procedure instructions.  She was advised to have backup contraception for one week.  Patient was also asked to check IUD strings periodically and follow up in 4 weeks for IUD check.    Hildred Laser, MD [facility]

## 2017-04-23 NOTE — Patient Instructions (Signed)

## 2017-05-17 ENCOUNTER — Encounter: Payer: Medicaid Other | Admitting: Obstetrics and Gynecology

## 2017-05-21 ENCOUNTER — Encounter: Payer: Medicaid Other | Admitting: Obstetrics and Gynecology

## 2017-05-21 ENCOUNTER — Encounter: Payer: Self-pay | Admitting: Obstetrics and Gynecology

## 2017-05-28 ENCOUNTER — Ambulatory Visit (INDEPENDENT_AMBULATORY_CARE_PROVIDER_SITE_OTHER): Payer: Medicaid Other | Admitting: Obstetrics and Gynecology

## 2017-05-28 ENCOUNTER — Encounter: Payer: Self-pay | Admitting: Obstetrics and Gynecology

## 2017-05-28 VITALS — BP 104/61 | HR 77 | Ht 69.0 in | Wt 171.4 lb

## 2017-05-28 DIAGNOSIS — Z30431 Encounter for routine checking of intrauterine contraceptive device: Secondary | ICD-10-CM

## 2017-05-28 NOTE — Progress Notes (Signed)
     GYNECOLOGY OFFICE PROGRESS NOTE  History:  36 y.o. [id number] here today for today for IUD string check; Mirena IUD was placed 04/23/2017. No complaints about the IUD, no concerning side effects.  The following portions of the patient's history were reviewed and updated as appropriate: allergies, current medications, past family history, past medical history, past social history, past surgical history and problem list. Last pap smear on 08/14/2016 was abnormal, ASCUS , negative HRHPV.  Review of Systems:  Pertinent items are noted in HPI.   Objective:  Physical Exam Blood pressure 104/61, pulse 77, height 5\' 9"  (1.753 m), weight 171 lb 6.4 oz (77.7 kg), currently breastfeeding. CONSTITUTIONAL: Well-developed, well-nourished female in no acute distress.  ABDOMEN: Soft, no distention noted.   PELVIC: Normal appearing external genitalia; normal appearing vaginal mucosa and cervix.  IUD strings visualized, about 3 cm in length outside cervix.  EXTREMITIES: extremities normal, atraumatic, no cyanosis or edema NEUROLOGIC: Grossly normal   Assessment & Plan:  Normal IUD check. Patient to keep IUD in place for five years; can come in for removal if she desires pregnancy within the next five years. Routine preventative health maintenance measures emphasized.   Hildred Laserherry, Madeeha Costantino, MD [facility]

## 2017-11-28 ENCOUNTER — Encounter: Payer: Medicaid Other | Admitting: Obstetrics and Gynecology

## 2017-12-27 ENCOUNTER — Encounter: Payer: Self-pay | Admitting: Obstetrics and Gynecology

## 2017-12-27 ENCOUNTER — Ambulatory Visit (INDEPENDENT_AMBULATORY_CARE_PROVIDER_SITE_OTHER): Payer: Medicaid Other | Admitting: Obstetrics and Gynecology

## 2017-12-27 VITALS — BP 114/55 | HR 79 | Ht 69.0 in | Wt 163.0 lb

## 2017-12-27 DIAGNOSIS — Z975 Presence of (intrauterine) contraceptive device: Secondary | ICD-10-CM

## 2017-12-27 DIAGNOSIS — Z30431 Encounter for routine checking of intrauterine contraceptive device: Secondary | ICD-10-CM

## 2017-12-27 DIAGNOSIS — Z01419 Encounter for gynecological examination (general) (routine) without abnormal findings: Secondary | ICD-10-CM

## 2017-12-27 DIAGNOSIS — R87610 Atypical squamous cells of undetermined significance on cytologic smear of cervix (ASC-US): Secondary | ICD-10-CM | POA: Diagnosis not present

## 2017-12-27 NOTE — Patient Instructions (Signed)

## 2017-12-27 NOTE — Progress Notes (Signed)
GYNECOLOGY ANNUAL PHYSICAL EXAM PROGRESS NOTE  Subjective:    Roberta Wagner is a 36 y.o. [phone] female who presents for an annual exam. The patient has no complaints today. The patient is sexually active. The patient wears seatbelts: yes. The patient participates in regular exercise: yes. Has the patient ever been transfused or tattooed?: no. The patient reports that there is not domestic violence in her life.    Gynecologic History Patient's last menstrual period was 12/15/2017. Menstrual History: OB History    Gravida Para Term Preterm AB Living   5 3 3   2 3    SAB TAB Ectopic Multiple Live Births   1     0 3      Menarche age: [age] Patient's last menstrual period was 12/15/2017. Contraception: IUD - Mirena, inserted 04/23/2017 History of STI's: Denies  Last Pap: 08/14/2016. Results were: abnormal.  ASCUS, HR HPV neg. Reports h/o abnormal pap smear x 1 in 2012, with negative colposcopy.    Obstetric History   G5   P3   T3   P0   A2   L3    SAB1   TAB0   Ectopic0   Multiple0   Live Births3     # Outcome Date GA Lbr Len/2nd Weight Sex Delivery Anes PTL Lv  5 Term 03/05/17 4252w1d / 00:12 7 lb 9.7 oz (3.45 kg) M VBAC EPI  LIV     Name: Meikle,BOY Ivelise     Apgar1:  8                Apgar5: 9  4 SAB 2016          3 AB 2014          2 Term 2014 5726w0d  7 lb 1.8 oz (3.225 kg) F CS-LTranv   LIV     Complications: Breech birth  1 Term 2012 2893w0d  7 lb 9.6 oz (3.447 kg) F Vag-Spont   LIV      Past Medical History:  Diagnosis Date  . Abnormal Pap smear of vagina 2012   colpo neg  . Kidney stone     Past Surgical History:  Procedure Laterality Date  . CESAREAN SECTION  2014  . kidney stone remove  2016    Family History  Problem Relation Age of Onset  . Breast cancer Neg Hx   . Cancer Neg Hx   . Diabetes Neg Hx   . Heart disease Neg Hx     Social History   Socioeconomic History  . Marital status: Married    Spouse name: Not on file  . Number of  children: Not on file  . Years of education: Not on file  . Highest education level: Not on file  Social Needs  . Financial resource strain: Not on file  . Food insecurity - worry: Not on file  . Food insecurity - inability: Not on file  . Transportation needs - medical: Not on file  . Transportation needs - non-medical: Not on file  Occupational History  . Not on file  Tobacco Use  . Smoking status: Never Smoker  . Smokeless tobacco: Never Used  Substance and Sexual Activity  . Alcohol use: No  . Drug use: No  . Sexual activity: Yes    Birth control/protection: IUD  Other Topics Concern  . Not on file  Social History Narrative  . Not on file    Current Outpatient Medications on File Prior to Visit  Medication Sig Dispense Refill  . levonorgestrel (MIRENA) 20 MCG/24HR IUD 1 each by Intrauterine route once.    . Prenatal Vit-Fe Fumarate-FA (MULTIVITAMIN-PRENATAL) 27-0.8 MG TABS tablet Take 1 tablet by mouth daily at 12 noon.     No current facility-administered medications on file prior to visit.     Allergies  Allergen Reactions  . Sulfa Antibiotics Hives    Review of Systems Constitutional: negative for chills, fatigue, fevers and sweats Eyes: negative for irritation, redness and visual disturbance Ears, nose, mouth, throat, and face: negative for hearing loss, nasal congestion, snoring and tinnitus Respiratory: negative for asthma, cough, sputum Cardiovascular: negative for chest pain, dyspnea, exertional chest pressure/discomfort, irregular heart beat, palpitations and syncope Gastrointestinal: negative for abdominal pain, change in bowel habits, nausea and vomiting Genitourinary: negative for abnormal menstrual periods, genital lesions, sexual problems and vaginal discharge, dysuria and urinary incontinence Integument/breast: negative for breast lump, breast tenderness and nipple discharge Hematologic/lymphatic: negative for bleeding and easy  bruising Musculoskeletal:negative for back pain and muscle weakness Neurological: negative for dizziness, headaches, vertigo and weakness Endocrine: negative for diabetic symptoms including polydipsia, polyuria and skin dryness Allergic/Immunologic: negative for hay fever and urticaria        Objective:  Blood pressure (!) 114/55, pulse 79, height 5\' 9"  (1.753 m), weight 163 lb (73.9 kg), last menstrual period 12/15/2017, currently breastfeeding. Body mass index is 24.07 kg/m.  General Appearance:    Alert, cooperative, no distress, appears stated age  Head:    Normocephalic, without obvious abnormality, atraumatic  Eyes:    PERRL, conjunctiva/corneas clear, EOM's intact, both eyes  Ears:    Normal external ear canals, both ears  Nose:   Nares normal, septum midline, mucosa normal, no drainage or sinus tenderness  Throat:   Lips, mucosa, and tongue normal; teeth and gums normal  Neck:   Supple, symmetrical, trachea midline, no adenopathy; thyroid: no enlargement/tenderness/nodules; no carotid bruit or JVD  Back:     Symmetric, no curvature, ROM normal, no CVA tenderness  Lungs:     Clear to auscultation bilaterally, respirations unlabored  Chest Wall:    No tenderness or deformity   Heart:    Regular rate and rhythm, S1 and S2 normal, no murmur, rub or gallop  Breast Exam:    No tenderness, masses, or nipple abnormality  Abdomen:     Soft, non-tender, bowel sounds active all four quadrants, no masses, no organomegaly.    Genitalia:    Pelvic:external genitalia normal, vagina without lesions, discharge, or tenderness, rectovaginal septum  normal. Cervix normal in appearance, no cervical motion tenderness, no adnexal masses or tenderness.  IUD threads visible, ~ 3 cm in length.  Uterus normal size, shape, mobile, regular contours, nontender.  Rectal:    Normal external sphincter.  No hemorrhoids appreciated. Internal exam not done.   Extremities:   Extremities normal, atraumatic, no cyanosis  or edema  Pulses:   2+ and symmetric all extremities  Skin:   Skin color, texture, turgor normal, no rashes or lesions  Lymph nodes:   Cervical, supraclavicular, and axillary nodes normal  Neurologic:   CNII-XII intact, normal strength, sensation and reflexes throughout   .  Labs:  Lab Results  Component Value Date   WBC 20.2 (H) 03/06/2017   HGB 11.6 (L) 03/06/2017   HCT 34.5 (L) 03/06/2017   MCV 80.0 03/06/2017   PLT 273 03/06/2017    No results found for: CREATININE, BUN, NA, K, CL, CO2  No results found for: ALT,  AST, GGT, ALKPHOS, BILITOT  No results found for: TSH   Assessment:   Well woman exam with routine gynecological exam  IUD (intrauterine device) in place  Pap smear abnormality of cervix with ASCUS favoring benign    Plan:     Blood tests: CBC with diff and Comprehensive metabolic panel. Breast self exam technique reviewed and patient encouraged to perform self-exam monthly. Contraception: Mirena IUD. Discussed healthy lifestyle modifications. Pap smear up to date, ASCUS HR HPV negative, can continue q 3 year screening based on ASCCP guidelines.  Follow up in 1 year.   Hildred Laserherry, Jolayne Branson, MD [facility]

## 2017-12-28 LAB — CBC
Hematocrit: 41.5 % (ref 34.0–46.6)
Hemoglobin: 14.1 g/dL (ref 11.1–15.9)
MCH: 29.1 pg (ref 26.6–33.0)
MCHC: 34 g/dL (ref 31.5–35.7)
MCV: 86 fL (ref 79–97)
Platelets: 280 10*3/uL (ref 150–379)
RBC: 4.85 x10E6/uL (ref 3.77–5.28)
RDW: 13.1 % (ref 12.3–15.4)
WBC: 4.2 10*3/uL (ref 3.4–10.8)

## 2017-12-28 LAB — COMPREHENSIVE METABOLIC PANEL
ALK PHOS: 45 IU/L (ref 39–117)
ALT: 22 IU/L (ref 0–32)
AST: 25 IU/L (ref 0–40)
Albumin/Globulin Ratio: 1.7 (ref 1.2–2.2)
Albumin: 4.3 g/dL (ref 3.5–5.5)
BILIRUBIN TOTAL: 0.5 mg/dL (ref 0.0–1.2)
BUN/Creatinine Ratio: 15 (ref 9–23)
BUN: 15 mg/dL (ref 6–20)
CHLORIDE: 104 mmol/L (ref 96–106)
CO2: 23 mmol/L (ref 20–29)
Calcium: 9.6 mg/dL (ref 8.7–10.2)
Creatinine, Ser: 1 mg/dL (ref 0.57–1.00)
GFR calc non Af Amer: 74 mL/min/{1.73_m2} (ref >59)
GFR, EST AFRICAN AMERICAN: 85 mL/min/{1.73_m2} (ref >59)
GLUCOSE: 80 mg/dL (ref 65–99)
Globulin, Total: 2.6 g/dL (ref 1.5–4.5)
POTASSIUM: 4.4 mmol/L (ref 3.5–5.2)
Sodium: 140 mmol/L (ref 134–144)
TOTAL PROTEIN: 6.9 g/dL (ref 6.0–8.5)

## 2018-09-05 ENCOUNTER — Telehealth: Payer: Self-pay

## 2018-09-05 NOTE — Telephone Encounter (Signed)
Copied from CRM [id number]. Topic: Appointment Scheduling - New Patient >> Sep 05, 2018  8:28 AM Tamela Oddi, NT wrote: New patient has been scheduled for your office. Provider: Leotis Shames Date of Appointment: 09/09/18  Route to department's PEC pool.

## 2018-09-09 ENCOUNTER — Encounter: Payer: Self-pay | Admitting: Family Medicine

## 2018-09-09 ENCOUNTER — Ambulatory Visit (INDEPENDENT_AMBULATORY_CARE_PROVIDER_SITE_OTHER): Payer: No Typology Code available for payment source | Admitting: Family Medicine

## 2018-09-09 VITALS — BP 112/58 | HR 79 | Temp 98.7°F | Ht 69.0 in | Wt 166.8 lb

## 2018-09-09 DIAGNOSIS — E559 Vitamin D deficiency, unspecified: Secondary | ICD-10-CM

## 2018-09-09 DIAGNOSIS — R5383 Other fatigue: Secondary | ICD-10-CM | POA: Diagnosis not present

## 2018-09-09 LAB — VITAMIN D 25 HYDROXY (VIT D DEFICIENCY, FRACTURES): VITD: 18.04 ng/mL — ABNORMAL LOW (ref 30.00–100.00)

## 2018-09-09 LAB — CBC
HCT: 41.1 % (ref 36.0–46.0)
Hemoglobin: 13.9 g/dL (ref 12.0–15.0)
MCHC: 33.8 g/dL (ref 30.0–36.0)
MCV: 86.7 fl (ref 78.0–100.0)
PLATELETS: 247 10*3/uL (ref 150.0–400.0)
RBC: 4.75 Mil/uL (ref 3.87–5.11)
RDW: 12.2 % (ref 11.5–15.5)
WBC: 3.8 10*3/uL — AB (ref 4.0–10.5)

## 2018-09-09 LAB — COMPREHENSIVE METABOLIC PANEL
ALK PHOS: 36 U/L — AB (ref 39–117)
ALT: 22 U/L (ref 0–35)
AST: 24 U/L (ref 0–37)
Albumin: 4.2 g/dL (ref 3.5–5.2)
BILIRUBIN TOTAL: 0.8 mg/dL (ref 0.2–1.2)
BUN: 17 mg/dL (ref 6–23)
CO2: 27 meq/L (ref 19–32)
CREATININE: 0.97 mg/dL (ref 0.40–1.20)
Calcium: 9.3 mg/dL (ref 8.4–10.5)
Chloride: 106 mEq/L (ref 96–112)
GFR: 84.07 mL/min (ref >60.00)
GLUCOSE: 85 mg/dL (ref 70–99)
Potassium: 3.9 mEq/L (ref 3.5–5.1)
Sodium: 138 mEq/L (ref 135–145)
TOTAL PROTEIN: 7 g/dL (ref 6.0–8.3)

## 2018-09-09 LAB — B12 AND FOLATE PANEL
Folate: 18.8 ng/mL (ref >5.9)
VITAMIN B 12: 358 pg/mL (ref 211–911)

## 2018-09-09 LAB — TSH: TSH: 0.47 u[IU]/mL (ref 0.35–4.50)

## 2018-09-09 NOTE — Patient Instructions (Signed)
Great to meet you! 

## 2018-09-09 NOTE — Progress Notes (Signed)
Subjective:    Patient ID: Roberta Wagner, female    DOB: [birth year], 37 y.o.   MRN: [id number]  HPI  Presents to clinic as a new patient to establish with PCP  She is a healthy person, currently takes no medications other than her IUD mirena.  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 She went to the 2008 [place] Olympics and placed 5th and has won gold in the track [employer].   Upcoming pap smear is scheduled for 12/2018  Tdap is UTD -- 2017  Declines flu vaccine today -- will get this at flu clinic via employee health  Patient Active Problem List   Diagnosis Date Noted  . Gastroenteritis 01/06/2017  . Preterm uterine contractions 12/27/2016  . Pap smear abnormality of cervix with ASCUS favoring benign 08/17/2016  . Amenorrhea 07/10/2016  . History of cesarean section 07/10/2016   Past Medical History:  Diagnosis Date  . Abnormal Pap smear of vagina 2012   colpo neg  . Kidney stone    Social History   Tobacco Use  . Smoking status: Never Smoker  . Smokeless tobacco: Never Used  Substance Use Topics  . Alcohol use: Yes   Family History  Problem Relation Age of Onset  . Hypertension Mother   . Stroke Father   . Cancer Maternal Grandfather   . Diabetes Paternal Grandmother   . Breast cancer Paternal Grandmother   . Cancer Paternal Grandfather     Review of Systems  Constitutional: Negative for chills, fever. Some fatigue -- could be related to work and school schedule.   HENT: Negative for congestion, ear pain, sinus pain and sore throat.   Eyes: Negative.   Respiratory: Negative for cough, shortness of breath and wheezing.   Cardiovascular: Negative for chest pain, palpitations and leg swelling.  Gastrointestinal: Negative for abdominal pain, diarrhea,  nausea and vomiting.  Genitourinary: Negative for dysuria, frequency and urgency.  Musculoskeletal: Negative for arthralgias and myalgias.  Skin: Negative for color change, pallor and rash.  Neurological: Negative for syncope, light-headedness and headaches.  Psychiatric/Behavioral: The patient is not nervous/anxious.       Objective:   Physical Exam Constitutional: She is oriented to person, place, and time. She appears well-developed and well-nourished. No distress.  Head: Normocephalic and atraumatic.  Eyes: Pupils are equal, round, and reactive to light. EOM are normal. No scleral icterus.  Neck: Normal range of motion. Neck supple. No tracheal deviation present.  Cardiovascular: Normal rate, regular rhythm and normal heart sounds.  Pulmonary/Chest: Effort normal and breath sounds normal. No respiratory distress. She has no wheezes. She has no rales.  Neurological: She is alert and oriented to person, place, and time.  Gait normal  Skin: Skin is warm and dry. No pallor.  Psychiatric: She has a normal mood and affect. Her behavior is normal. Thought content normal.  Nursing note and vitals reviewed.  Vitals:   09/09/18 1313  BP: (!) 112/58  Pulse: 79  Temp: 98.7 F (37.1 C)  SpO2: 96%      Assessment & Plan:    Fatigue - we will get blood work to further assess fatigue including CBC, thyroid, electrolyte panel, vitamin D and B12.  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   We will make patient aware of lab results when available.  Patient advised to come in once annually for general wellness visits, otherwise can come to clinic at any time if illness or issues arise.

## 2018-09-10 MED ORDER — ERGOCALCIFEROL 50 MCG (2000 UT) PO TABS: 1.0000 | ORAL_TABLET | Freq: Every day | ORAL | 1 refills | Status: DC

## 2018-09-10 NOTE — Addendum Note (Signed)
Addended by: Leanora Cover on: 09/10/2018 05:51 PM   Modules accepted: Orders

## 2019-01-01 ENCOUNTER — Ambulatory Visit (INDEPENDENT_AMBULATORY_CARE_PROVIDER_SITE_OTHER): Payer: No Typology Code available for payment source | Admitting: Obstetrics and Gynecology

## 2019-01-01 ENCOUNTER — Encounter: Payer: Self-pay | Admitting: Obstetrics and Gynecology

## 2019-01-01 VITALS — BP 123/73 | HR 69 | Ht 69.0 in | Wt 171.3 lb

## 2019-01-01 DIAGNOSIS — Z975 Presence of (intrauterine) contraceptive device: Secondary | ICD-10-CM | POA: Diagnosis not present

## 2019-01-01 DIAGNOSIS — Z01419 Encounter for gynecological examination (general) (routine) without abnormal findings: Secondary | ICD-10-CM | POA: Diagnosis not present

## 2019-01-01 NOTE — Progress Notes (Signed)
PT is present today for her annual exam. Pt stated that she has been doing self-breast exams monthly. Pt stated that she is doing well and denies any issues. No problems or concerns. Pt had flu vaccine in Oct 2019.

## 2019-01-01 NOTE — Progress Notes (Signed)
GYNECOLOGY ANNUAL PHYSICAL EXAM PROGRESS NOTE  Subjective:    Roberta Wagner is a 38 y.o. [phone] female who presents for an annual exam. The patient has no complaints today. The patient is sexually active. The patient wears seatbelts: yes. The patient participates in regular exercise: yes. Has the patient ever been transfused or tattooed?: no. The patient reports that there is not domestic violence in her life.   Patient notes that she and her husband are considering having 1 more child, but possibly in the next 4-6 months.    Gynecologic History  Menarche age: 89 Patient's last menstrual period was 12/04/2018. Contraception: IUD - Mirena, inserted 04/23/2017 History of STI's: Denies  Last Pap: 08/14/2016. Results were: abnormal.  ASCUS, HR HPV neg. Reports h/o abnormal pap smear x 1 in 2012, with negative colposcopy. PCP: Leanora CoverLauren Guse, FNP.  [facility]   OB History  Gravida Para Term Preterm AB Living  5 3 3  0 2 3  SAB TAB Ectopic Multiple Live Births  1 0 0 0 3    # Outcome Date GA Lbr Len/2nd Weight Sex Delivery Anes PTL Lv  5 Term 03/05/17 1783w1d / 00:12 7 lb 9.7 oz (3.45 kg) M VBAC EPI  LIV     Name: Grulke,BOY Dominik     Apgar1: 8  Apgar5: 9  4 SAB 2016          3 AB 2014          2 Term 2014 3730w0d  7 lb 1.8 oz (3.225 kg) F CS-LTranv   LIV     Complications: Breech birth  1 Term 2012 3211w0d  7 lb 9.6 oz (3.447 kg) F Vag-Spont   LIV    Past Medical History:  Diagnosis Date  . Abnormal Pap smear of vagina 2012   colpo neg  . Kidney stone   . Vitamin D deficiency     Past Surgical History:  Procedure Laterality Date  . CESAREAN SECTION  2014  . kidney stone remove  2016    Family History  Problem Relation Age of Onset  . Hypertension Mother   . Stroke Father   . Cancer Maternal Grandfather   . Diabetes Paternal Grandmother   . Breast cancer Paternal Grandmother   . Cancer Paternal Grandfather     Social History   Socioeconomic History    . Marital status: Married    Spouse name: Not on file  . Number of children: Not on file  . Years of education: Not on file  . Highest education level: Not on file  Occupational History  . Not on file  Social Needs  . Financial resource strain: Not on file  . Food insecurity:    Worry: Not on file    Inability: Not on file  . Transportation needs:    Medical: Not on file    Non-medical: Not on file  Tobacco Use  . Smoking status: Never Smoker  . Smokeless tobacco: Never Used  Substance and Sexual Activity  . Alcohol use: Not Currently  . Drug use: No  . Sexual activity: Yes    Birth control/protection: I.U.D.  Lifestyle  . Physical activity:    Days per week: Not on file    Minutes per session: Not on file  . Stress: Not on file  Relationships  . Social connections:    Talks on phone: Not on file    Gets together: Not on file    Attends religious service:  Not on file    Active member of club or organization: Not on file    Attends meetings of clubs or organizations: Not on file    Relationship status: Not on file  . Intimate partner violence:    Fear of current or ex partner: Not on file    Emotionally abused: Not on file    Physically abused: Not on file    Forced sexual activity: Not on file  Other Topics Concern  . Not on file  Social History Narrative  . Not on file    Current Outpatient Medications on File Prior to Visit  Medication Sig Dispense Refill  . levonorgestrel (MIRENA) 20 MCG/24HR IUD 1 each by Intrauterine route once.    . Multiple Vitamin (MULTI-VITAMIN DAILY) TABS Take by mouth.    . Prenatal Vit-Fe Fumarate-FA (MULTIVITAMIN-PRENATAL) 27-0.8 MG TABS tablet Take 1 tablet by mouth daily at 12 noon.     No current facility-administered medications on file prior to visit.     Allergies  Allergen Reactions  . Sulfa Antibiotics Hives    Review of Systems Constitutional: negative for chills, fatigue, fevers and sweats Eyes: negative for  irritation, redness and visual disturbance Ears, nose, mouth, throat, and face: negative for hearing loss, nasal congestion, snoring and tinnitus Respiratory: negative for asthma, cough, sputum Cardiovascular: negative for chest pain, dyspnea, exertional chest pressure/discomfort, irregular heart beat, palpitations and syncope Gastrointestinal: negative for abdominal pain, change in bowel habits, nausea and vomiting Genitourinary: negative for abnormal menstrual periods, genital lesions, sexual problems and vaginal discharge, dysuria and urinary incontinence Integument/breast: negative for breast lump, breast tenderness and nipple discharge Hematologic/lymphatic: negative for bleeding and easy bruising Musculoskeletal:negative for back pain and muscle weakness Neurological: negative for dizziness, headaches, vertigo and weakness Endocrine: negative for diabetic symptoms including polydipsia, polyuria and skin dryness Allergic/Immunologic: negative for hay fever and urticaria        Objective:  Blood pressure 123/73, pulse 69, height 5\' 9"  (1.753 m), weight 171 lb 4.8 oz (77.7 kg), last menstrual period 12/04/2018, not currently breastfeeding. Body mass index is 25.3 kg/m.  General Appearance:    Alert, cooperative, no distress, appears stated age  Head:    Normocephalic, without obvious abnormality, atraumatic  Eyes:    PERRL, conjunctiva/corneas clear, EOM's intact, both eyes  Ears:    Normal external ear canals, both ears  Nose:   Nares normal, septum midline, mucosa normal, no drainage or sinus tenderness  Throat:   Lips, mucosa, and tongue normal; teeth and gums normal  Neck:   Supple, symmetrical, trachea midline, no adenopathy; thyroid: no enlargement/tenderness/nodules; no carotid bruit or JVD  Back:     Symmetric, no curvature, ROM normal, no CVA tenderness  Lungs:     Clear to auscultation bilaterally, respirations unlabored  Chest Wall:    No tenderness or deformity   Heart:     Regular rate and rhythm, S1 and S2 normal, no murmur, rub or gallop  Breast Exam:    No tenderness, masses, or nipple abnormality  Abdomen:     Soft, non-tender, bowel sounds active all four quadrants, no masses, no organomegaly.    Genitalia:    Pelvic:external genitalia normal, vagina without lesions, discharge, or tenderness, rectovaginal septum  normal. Cervix normal in appearance, no cervical motion tenderness, no adnexal masses or tenderness.  IUD threads visible, ~ 2 cm in length.  Uterus normal size, shape, mobile, regular contours, nontender.  Rectal:    Normal external sphincter.  No hemorrhoids  appreciated. Internal exam not done.   Extremities:   Extremities normal, atraumatic, no cyanosis or edema  Pulses:   2+ and symmetric all extremities  Skin:   Skin color, texture, turgor normal, no rashes or lesions  Lymph nodes:   Cervical, supraclavicular, and axillary nodes normal  Neurologic:   CNII-XII intact, normal strength, sensation and reflexes throughout   .  Labs:  Lab Results  Component Value Date   WBC 3.8 (L) 09/09/2018   HGB 13.9 09/09/2018   HCT 41.1 09/09/2018   MCV 86.7 09/09/2018   PLT 247.0 09/09/2018    Lab Results  Component Value Date   CREATININE 0.97 09/09/2018   BUN 17 09/09/2018   NA 138 09/09/2018   K 3.9 09/09/2018   CL 106 09/09/2018   CO2 27 09/09/2018    Lab Results  Component Value Date   ALT 22 09/09/2018   AST 24 09/09/2018   ALKPHOS 36 (L) 09/09/2018   BILITOT 0.8 09/09/2018    Lab Results  Component Value Date   TSH 0.47 09/09/2018     Assessment:   Well woman exam with routine gynecological exam IUD (intrauterine device) in place   Plan:    Blood tests: Completed by PCP in September Breast self exam technique reviewed and patient encouraged to perform self-exam monthly. Contraception: Mirena IUD.  Considering conception in the next several months. To schedule appointment when ready for removal.  Discussed healthy  lifestyle modifications. Pap smear up to date. Flu vaccine up to date. Follow up in 1 year.   Hildred Laser, MD [facility]

## 2019-01-01 NOTE — Patient Instructions (Signed)

## 2019-03-31 ENCOUNTER — Ambulatory Visit: Payer: No Typology Code available for payment source | Admitting: Obstetrics and Gynecology

## 2019-05-13 ENCOUNTER — Ambulatory Visit (INDEPENDENT_AMBULATORY_CARE_PROVIDER_SITE_OTHER): Payer: No Typology Code available for payment source | Admitting: Obstetrics and Gynecology

## 2019-05-13 ENCOUNTER — Other Ambulatory Visit: Payer: Self-pay

## 2019-05-13 ENCOUNTER — Encounter: Payer: Self-pay | Admitting: Obstetrics and Gynecology

## 2019-05-13 VITALS — BP 101/62 | HR 68 | Ht 69.0 in | Wt 165.7 lb

## 2019-05-13 DIAGNOSIS — Z30432 Encounter for removal of intrauterine contraceptive device: Secondary | ICD-10-CM

## 2019-05-13 NOTE — Progress Notes (Signed)
Pt is present today for IUD removal. Pt stated that she did not want to get the IUD replaced and do not wish to try another form of birth control.

## 2019-05-13 NOTE — Progress Notes (Signed)
    GYNECOLOGY OFFICE PROCEDURE NOTE  Roberta Wagner is a 38 y.o. [phone] here for Mirena IUD removal. No GYN concerns.  Last pap smear was on  08/14/2016 and was: ASCUS HR HPV negative.  IUD Removal  Patient identified, informed consent performed, consent signed.  Patient was in the dorsal lithotomy position, normal external genitalia was noted.  A speculum was placed in the patient's vagina, normal discharge was noted, no lesions. The cervix was visualized, no lesions, no abnormal discharge.  The strings of the IUD were grasped and pulled using ring forceps. The IUD was removed in its entirety. Patient tolerated the procedure well.    Patient may be considering plans for pregnancy and she was told to avoid teratogens, take PNV and folic acid when actively attempting to conceive.  She does also inquire about non-hormonal contraceptive options, given info on ParaGard IUD.  Routine preventative health maintenance measures emphasized.    Hildred Laser, MD [facility]

## 2019-07-14 ENCOUNTER — Emergency Department: Payer: No Typology Code available for payment source

## 2019-07-14 ENCOUNTER — Emergency Department
Admission: EM | Admit: 2019-07-14 | Discharge: 2019-07-14 | Disposition: A | Discharge status: Home / Self Care | Payer: No Typology Code available for payment source | Attending: Emergency Medicine | Admitting: Emergency Medicine

## 2019-07-14 ENCOUNTER — Other Ambulatory Visit: Payer: Self-pay

## 2019-07-14 DIAGNOSIS — Z79899 Other long term (current) drug therapy: Secondary | ICD-10-CM | POA: Diagnosis not present

## 2019-07-14 DIAGNOSIS — Y939 Activity, unspecified: Secondary | ICD-10-CM | POA: Diagnosis not present

## 2019-07-14 DIAGNOSIS — Y92410 Unspecified street and highway as the place of occurrence of the external cause: Secondary | ICD-10-CM | POA: Insufficient documentation

## 2019-07-14 DIAGNOSIS — Y999 Unspecified external cause status: Secondary | ICD-10-CM | POA: Diagnosis not present

## 2019-07-14 DIAGNOSIS — S79911A Unspecified injury of right hip, initial encounter: Secondary | ICD-10-CM | POA: Diagnosis present

## 2019-07-14 DIAGNOSIS — S7001XA Contusion of right hip, initial encounter: Secondary | ICD-10-CM | POA: Diagnosis not present

## 2019-07-14 LAB — POCT PREGNANCY, URINE: Preg Test, Ur: NEGATIVE

## 2019-07-14 MED ORDER — METHOCARBAMOL 500 MG PO TABS
1000.0000 mg | ORAL_TABLET | Freq: Once | ORAL | Status: AC
  Administered 2019-07-14: 22:00:00 1000 mg via ORAL
  Filled 2019-07-14: qty 2

## 2019-07-14 MED ORDER — METHOCARBAMOL 500 MG PO TABS: 500.0000 mg | ORAL_TABLET | Freq: Four times a day (QID) | ORAL | 0 refills | Status: DC

## 2019-07-14 MED ORDER — MELOXICAM 15 MG PO TABS: 15.0000 mg | ORAL_TABLET | Freq: Every day | ORAL | 0 refills | Status: DC

## 2019-07-14 MED ORDER — OXYCODONE-ACETAMINOPHEN 5-325 MG PO TABS
1.0000 | ORAL_TABLET | Freq: Once | ORAL | Status: AC
  Administered 2019-07-14: 22:00:00 1 via ORAL
  Filled 2019-07-14: qty 1

## 2019-07-14 MED ORDER — OXYCODONE-ACETAMINOPHEN 5-325 MG PO TABS: 1.0000 | ORAL_TABLET | Freq: Four times a day (QID) | ORAL | 0 refills | Status: DC | PRN

## 2019-07-14 MED ORDER — MELOXICAM 7.5 MG PO TABS
15.0000 mg | ORAL_TABLET | Freq: Once | ORAL | Status: AC
  Administered 2019-07-14: 15 mg via ORAL
  Filled 2019-07-14: qty 2
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
## 2019-07-14 NOTE — ED Notes (Signed)
Pt to xray

## 2019-07-22 ENCOUNTER — Telehealth: Payer: Self-pay | Admitting: Family Medicine

## 2019-07-22 ENCOUNTER — Other Ambulatory Visit: Payer: Self-pay

## 2019-07-22 ENCOUNTER — Ambulatory Visit (INDEPENDENT_AMBULATORY_CARE_PROVIDER_SITE_OTHER): Payer: No Typology Code available for payment source | Admitting: Family Medicine

## 2019-07-22 DIAGNOSIS — M542 Cervicalgia: Secondary | ICD-10-CM | POA: Diagnosis not present

## 2019-07-22 MED ORDER — PREDNISONE 10 MG (21) PO TBPK: ORAL_TABLET | ORAL | 0 refills | Status: DC
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
## 2019-07-23 NOTE — Telephone Encounter (Signed)
Ok, Thank You!

## 2019-07-23 NOTE — Telephone Encounter (Signed)
Pt is scheduled for xray 7/28

## 2019-07-24 ENCOUNTER — Telehealth: Payer: Self-pay

## 2019-07-24 NOTE — Telephone Encounter (Signed)
Reason for CRM: Pt would like to have her referral for Chiropractic appt be sent to:    [facility] [address] [place], [place] [address] Ph  [phone]

## 2019-07-24 NOTE — Telephone Encounter (Signed)
Copied from [place] [id number]. Topic: Referral - Question >> Jul 24, 2019  9:39 AM Burchel, Abbi R wrote: Reason for CRM: Pt would like to have her referral for Chiropractic appt be sent to:    [facility] [address] [place], [place] [address] Ph  [phone]

## 2019-07-27 NOTE — Telephone Encounter (Signed)
Referral faxed to [facility]

## 2019-07-28 ENCOUNTER — Ambulatory Visit (INDEPENDENT_AMBULATORY_CARE_PROVIDER_SITE_OTHER): Payer: No Typology Code available for payment source

## 2019-07-28 ENCOUNTER — Other Ambulatory Visit: Payer: Self-pay

## 2019-07-28 DIAGNOSIS — M542 Cervicalgia: Secondary | ICD-10-CM
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
## 2019-10-31 ENCOUNTER — Other Ambulatory Visit: Payer: Self-pay

## 2019-10-31 ENCOUNTER — Ambulatory Visit
Admission: EM | Admit: 2019-10-31 | Discharge: 2019-10-31 | Disposition: A | Discharge status: Home / Self Care | Payer: No Typology Code available for payment source | Attending: Family Medicine | Admitting: Family Medicine

## 2019-10-31 ENCOUNTER — Encounter: Payer: Self-pay | Admitting: Emergency Medicine

## 2019-10-31 DIAGNOSIS — Z20828 Contact with and (suspected) exposure to other viral communicable diseases: Secondary | ICD-10-CM | POA: Diagnosis not present

## 2019-10-31 DIAGNOSIS — Z20822 Contact with and (suspected) exposure to covid-19: Secondary | ICD-10-CM

## 2019-10-31 NOTE — ED Triage Notes (Signed)
Pt needing a covid test for international travel. They require a RT- PCR test. She is asymptomatic.

## 2019-10-31 NOTE — ED Provider Notes (Signed)
[facility]    CSN: [id number] Arrival date & time: 10/31/19  1113  History   Chief Complaint Chief Complaint  Patient presents with  . covid testing   HPI  38 year old female presents for Covid testing.  Patient is traveling to [place].  She is required to have a Covid test before she is allowed to travel.  She is traveling next week.  She is feeling well.  She is currently asymptomatic.  Patient simply desires a Covid test.  She denies any recent exposure.  No other complaints.  PMH, Surgical Hx, Family Hx, Social History reviewed and updated as below.  Past Medical History:  Diagnosis Date  . Abnormal Pap smear of vagina 2012   colpo neg  . Kidney stone   . Vitamin D deficiency    Patient Active Problem List   Diagnosis Date Noted  . Gastroenteritis 01/06/2017  . Preterm uterine contractions 12/27/2016  . Pap smear abnormality of cervix with ASCUS favoring benign 08/17/2016  . Amenorrhea 07/10/2016  . History of cesarean section 07/10/2016   Past Surgical History:  Procedure Laterality Date  . CESAREAN SECTION  2014  . kidney stone remove  2016    OB History    Gravida  5   Para  3   Term  3   Preterm      AB  2   Living  3     SAB  1   TAB      Ectopic      Multiple  0   Live Births  3            Home Medications    Prior to Admission medications   Medication Sig Start Date End Date Taking? Authorizing Provider  Prenatal Vit-Fe Fumarate-FA (MULTIVITAMIN-PRENATAL) 27-0.8 MG TABS tablet Take 1 tablet by mouth daily at 12 noon.   Yes [provider]  meloxicam (MOBIC) 15 MG tablet Take 1 tablet (15 mg total) by mouth daily. 07/14/19   Cuthriell, Charline Bills, PA-C  methocarbamol (ROBAXIN) 500 MG tablet Take 1 tablet (500 mg total) by mouth 4 (four) times daily. 07/14/19   Cuthriell, Charline Bills, PA-C  Multiple Vitamin (MULTI-VITAMIN DAILY) TABS Take by mouth.    [provider]  oxyCODONE-acetaminophen  (PERCOCET/ROXICET) 5-325 MG tablet Take 1 tablet by mouth every 6 (six) hours as needed for severe pain. 07/14/19   Cuthriell, Charline Bills, PA-C  predniSONE (STERAPRED UNI-PAK 21 TAB) 10 MG (21) TBPK tablet Take according to pack instructions 07/22/19   Jodelle Green, FNP    Family History Family History  Problem Relation Age of Onset  . Hypertension Mother   . Stroke Father   . Cancer Maternal Grandfather   . Diabetes Paternal Grandmother   . Breast cancer Paternal Grandmother   . Cancer Paternal Grandfather     Social History Social History   Tobacco Use  . Smoking status: Never Smoker  . Smokeless tobacco: Never Used  Substance Use Topics  . Alcohol use: Not Currently  . Drug use: No     Allergies   Sulfa antibiotics   Review of Systems Review of Systems  Constitutional: Negative.   HENT: Negative.   Respiratory: Negative.    Physical Exam Triage Vital Signs ED Triage Vitals  Enc Vitals Group     BP 10/31/19 1145 127/69     Pulse Rate 10/31/19 1145 66     Resp 10/31/19 1145 16     Temp 10/31/19  1145 98 F (36.7 C)     Temp Source 10/31/19 1145 Oral     SpO2 10/31/19 1145 100 %     Weight 10/31/19 1141 165 lb (74.8 kg)     Height 10/31/19 1141 5\' 9"  (1.753 m)     Head Circumference --      Peak Flow --      Pain Score 10/31/19 1141 0     Pain Loc --      Pain Edu? --      Excl. in GC? --    Updated Vital Signs BP 127/69 (BP Location: Left Arm)   Pulse 66   Temp 98 F (36.7 C) (Oral)   Resp 16   Ht 5\' 9"  (1.753 m)   Wt 74.8 kg   LMP 10/13/2019 (Approximate)   SpO2 100%   BMI 24.37 kg/m   Visual Acuity Right Eye Distance:   Left Eye Distance:   Bilateral Distance:    Right Eye Near:   Left Eye Near:    Bilateral Near:     Physical Exam Vitals signs and nursing note reviewed.  Constitutional:      General: She is not in acute distress.    Appearance: Normal appearance. She is not ill-appearing.  HENT:     Head: Normocephalic and  atraumatic.  Eyes:     General:        Right eye: No discharge.        Left eye: No discharge.     Conjunctiva/sclera: Conjunctivae normal.  Cardiovascular:     Rate and Rhythm: Normal rate and regular rhythm.  Pulmonary:     Effort: Pulmonary effort is normal.     Breath sounds: Normal breath sounds. No wheezing, rhonchi or rales.  Neurological:     Mental Status: She is alert.  Psychiatric:        Mood and Affect: Mood normal.        Behavior: Behavior normal.    UC Treatments / Results  Labs (all labs ordered are listed, but only abnormal results are displayed) Labs Reviewed  NOVEL CORONAVIRUS, NAA (HOSP ORDER, SEND-OUT TO REF LAB; TAT 18-24 HRS)    EKG   Radiology No results found.  Procedures Procedures (including critical care time)  Medications Ordered in UC Medications - No data to display  Initial Impression / Assessment and Plan / UC Course  I have reviewed the triage vital signs and the nursing notes.  Pertinent labs & imaging results that were available during my care of the patient were reviewed by me and considered in my medical decision making (see chart for details).    38 year old female presents for Covid testing.  She is traveling and the test is required within 180 hours of her travel date.  She is currently asymptomatic and has not been exposed.  Awaiting Covid test result.  Final Clinical Impressions(s) / UC Diagnoses   Final diagnoses:  Encounter for screening laboratory testing for COVID-19 virus in asymptomatic patient   Discharge Instructions   None    ED Prescriptions    None     PDMP not reviewed this encounter.   10/15/2019, 31 10/31/19 1157

## 2019-11-01 LAB — NOVEL CORONAVIRUS, NAA (HOSP ORDER, SEND-OUT TO REF LAB; TAT 18-24 HRS): SARS-CoV-2, NAA: NOT DETECTED

## 2020-01-05 ENCOUNTER — Other Ambulatory Visit (HOSPITAL_COMMUNITY)
Admission: RE | Admit: 2020-01-05 | Discharge: 2020-01-05 | Disposition: A | Discharge status: Home / Self Care | Payer: No Typology Code available for payment source | Source: Ambulatory Visit | Attending: Obstetrics and Gynecology | Admitting: Obstetrics and Gynecology

## 2020-01-05 ENCOUNTER — Other Ambulatory Visit: Payer: Self-pay

## 2020-01-05 ENCOUNTER — Ambulatory Visit (INDEPENDENT_AMBULATORY_CARE_PROVIDER_SITE_OTHER): Payer: No Typology Code available for payment source | Admitting: Obstetrics and Gynecology

## 2020-01-05 ENCOUNTER — Encounter: Payer: Self-pay | Admitting: Obstetrics and Gynecology

## 2020-01-05 VITALS — BP 96/55 | HR 67 | Ht 69.0 in | Wt 178.0 lb

## 2020-01-05 DIAGNOSIS — Z8639 Personal history of other endocrine, nutritional and metabolic disease: Secondary | ICD-10-CM | POA: Diagnosis not present

## 2020-01-05 DIAGNOSIS — Z124 Encounter for screening for malignant neoplasm of cervix: Secondary | ICD-10-CM

## 2020-01-05 DIAGNOSIS — Z01419 Encounter for gynecological examination (general) (routine) without abnormal findings: Secondary | ICD-10-CM | POA: Diagnosis present

## 2020-01-05 DIAGNOSIS — E663 Overweight: Secondary | ICD-10-CM | POA: Diagnosis not present

## 2020-01-05 NOTE — Progress Notes (Signed)
Pt present for annual exam. Pt had flu vaccine in Oct 2020.  Pt's last pap 08/14/2016 results abnormal. Pt LMP 12/18/19. Pt stated that she was doing well no problems.

## 2020-01-05 NOTE — Progress Notes (Signed)
GYNECOLOGY ANNUAL PHYSICAL EXAM PROGRESS NOTE  Subjective:    Roberta Wagner is a 39 y.o. [id number] female who presents for an annual exam. The patient has no complaints today. The patient is sexually active. The patient wears seatbelts: yes. The patient participates in regular exercise: yes. Has the patient ever been transfused or tattooed?: no. The patient reports that there is not domestic violence in her life.    Gynecologic History  Menarche age: 32 Patient's last menstrual period was 12/18/2019. Contraception: None. Initially planned on conceiving after removal of her IUD in May but now delaying until June (planning for Olympic trials).   History of STI's: Denies  Last Pap: 08/14/2016. Results were: abnormal.  ASCUS, HR HPV neg. Reports h/o abnormal pap smear x 1 in 2012, with negative colposcopy. PCP: Philis Nettle, FNP.  [facility]   OB History  Gravida Para Term Preterm AB Living  5 3 3  0 2 3  SAB TAB Ectopic Multiple Live Births  1 0 0 0 3    # Outcome Date GA Lbr Len/2nd Weight Sex Delivery Anes PTL Lv  5 Term 03/05/17 [redacted]w[redacted]d / 00:12 7 lb 9.7 oz (3.45 kg) M VBAC EPI  LIV     Name: Maahs,BOY Abigael     Apgar1: 8  Apgar5: 9  4 SAB 2016          3 AB 2014          2 Term 2014 [redacted]w[redacted]d  7 lb 1.8 oz (3.225 kg) F CS-LTranv   LIV     Complications: Breech birth  1 Term 2012 [redacted]w[redacted]d  7 lb 9.6 oz (3.447 kg) F Vag-Spont   LIV    Past Medical History:  Diagnosis Date  . Abnormal Pap smear of vagina 2012   colpo neg  . Kidney stone   . Vitamin D deficiency     Past Surgical History:  Procedure Laterality Date  . CESAREAN SECTION  2014  . kidney stone remove  2016    Family History  Problem Relation Age of Onset  . Hypertension Mother   . Stroke Father   . Cancer Maternal Grandfather   . Diabetes Paternal Grandmother   . Breast cancer Paternal Grandmother   . Cancer Paternal Grandfather     Social History   Socioeconomic History  . Marital status:  Married    Spouse name: Not on file  . Number of children: Not on file  . Years of education: Not on file  . Highest education level: Not on file  Occupational History  . Not on file  Tobacco Use  . Smoking status: Never Smoker  . Smokeless tobacco: Never Used  Substance and Sexual Activity  . Alcohol use: Not Currently  . Drug use: No  . Sexual activity: Yes    Birth control/protection: None  Other Topics Concern  . Not on file  Social History Narrative  . Not on file   Social Determinants of Health   Financial Resource Strain:   . Difficulty of Paying Living Expenses: Not on file  Food Insecurity:   . Worried About Charity fundraiser in the Last Year: Not on file  . Ran Out of Food in the Last Year: Not on file  Transportation Needs:   . Lack of Transportation (Medical): Not on file  . Lack of Transportation (Non-Medical): Not on file  Physical Activity:   . Days of Exercise per Week: Not on file  . Minutes  of Exercise per Session: Not on file  Stress:   . Feeling of Stress : Not on file  Social Connections:   . Frequency of Communication with Friends and Family: Not on file  . Frequency of Social Gatherings with Friends and Family: Not on file  . Attends Religious Services: Not on file  . Active Member of Clubs or Organizations: Not on file  . Attends Banker Meetings: Not on file  . Marital Status: Not on file  Intimate Partner Violence:   . Fear of Current or Ex-Partner: Not on file  . Emotionally Abused: Not on file  . Physically Abused: Not on file  . Sexually Abused: Not on file    Current Outpatient Medications on File Prior to Visit  Medication Sig Dispense Refill  . Multiple Vitamin (MULTI-VITAMIN DAILY) TABS Take by mouth.     No current facility-administered medications on file prior to visit.    Allergies  Allergen Reactions  . Sulfa Antibiotics Hives    Review of Systems Constitutional: negative for chills, fatigue, fevers and  sweats Eyes: negative for irritation, redness and visual disturbance Ears, nose, mouth, throat, and face: negative for hearing loss, nasal congestion, snoring and tinnitus Respiratory: negative for asthma, cough, sputum Cardiovascular: negative for chest pain, dyspnea, exertional chest pressure/discomfort, irregular heart beat, palpitations and syncope Gastrointestinal: negative for abdominal pain, change in bowel habits, nausea and vomiting Genitourinary: negative for abnormal menstrual periods, genital lesions, sexual problems and vaginal discharge, dysuria and urinary incontinence Integument/breast: negative for breast lump, breast tenderness and nipple discharge Hematologic/lymphatic: negative for bleeding and easy bruising Musculoskeletal:negative for back pain and muscle weakness Neurological: negative for dizziness, headaches, vertigo and weakness Endocrine: negative for diabetic symptoms including polydipsia, polyuria and skin dryness Allergic/Immunologic: negative for hay fever and urticaria        Objective:  Blood pressure (!) 96/55, pulse 67, height 5\' 9"  (1.753 m), weight 178 lb (80.7 kg), last menstrual period 12/18/2019. Body mass index is 26.29 kg/m.  General Appearance:    Alert, cooperative, no distress, appears stated age, overweight.   Head:    Normocephalic, without obvious abnormality, atraumatic  Eyes:    PERRL, conjunctiva/corneas clear, EOM's intact, both eyes  Ears:    Normal external ear canals, both ears.   Nose:   Nares normal, septum midline, mucosa normal, no drainage or sinus tenderness  Throat:   Lips, mucosa, and tongue normal; teeth and gums normal  Neck:   Supple, symmetrical, trachea midline, no adenopathy; thyroid: no enlargement/tenderness/nodules; no carotid bruit or JVD  Back:     Symmetric, no curvature, ROM normal, no CVA tenderness  Lungs:     Clear to auscultation bilaterally, respirations unlabored  Chest Wall:    No tenderness or deformity     Heart:    Regular rate and rhythm, S1 and S2 normal, no murmur, rub or gallop  Breast Exam:    No tenderness, masses, or nipple abnormality  Abdomen:     Soft, non-tender, bowel sounds active all four quadrants, no masses, no organomegaly.    Genitalia:    Pelvic:external genitalia normal, vagina without lesions, discharge, or tenderness, rectovaginal septum  normal. Cervix normal in appearance, no cervical motion tenderness, no adnexal masses or tenderness.  IUD threads visible, ~ 2 cm in length.  Uterus normal size, shape, mobile, regular contours, nontender.  Rectal:    Normal external sphincter.  No hemorrhoids appreciated. Internal exam not done.   Extremities:   Extremities normal,  atraumatic, no cyanosis or edema  Pulses:   2+ and symmetric all extremities  Skin:   Skin color, texture, turgor normal, no rashes or lesions  Lymph nodes:   Cervical, supraclavicular, and axillary nodes normal  Neurologic:   CNII-XII intact, normal strength, sensation and reflexes throughout   .  Labs:  Lab Results  Component Value Date   WBC 3.8 (L) 09/09/2018   HGB 13.9 09/09/2018   HCT 41.1 09/09/2018   MCV 86.7 09/09/2018   PLT 247.0 09/09/2018    Lab Results  Component Value Date   CREATININE 0.97 09/09/2018   BUN 17 09/09/2018   NA 138 09/09/2018   K 3.9 09/09/2018   CL 106 09/09/2018   CO2 27 09/09/2018    Lab Results  Component Value Date   ALT 22 09/09/2018   AST 24 09/09/2018   ALKPHOS 36 (L) 09/09/2018   BILITOT 0.8 09/09/2018    Lab Results  Component Value Date   TSH 0.47 09/09/2018     Assessment:   Well woman exam with routine gynecological exam Overweight Cervical cancer screening History of Vitamin D deficiency  Plan:    Blood tests: None ordered. Had labs at [facility] in July.  Breast self exam technique reviewed and patient encouraged to perform self-exam monthly. Contraception: None.  Discussed healthy lifestyle modifications. Pap smear performed today.   Currently on Vitamin D supplementation for h/o Vit D deficiency.  Flu vaccine up to date. Follow up in 1 year.   Hildred Laser, MD [facility]

## 2020-01-05 NOTE — Patient Instructions (Addendum)
Health Maintenance, Female Adopting a healthy lifestyle and getting preventive care are important in promoting health and wellness. Ask your health care provider about:  The right schedule for you to have regular tests and exams.  Things you can do on your own to prevent diseases and keep yourself healthy. What should I know about diet, weight, and exercise? Eat a healthy diet   Eat a diet that includes plenty of vegetables, fruits, low-fat dairy products, and lean protein.  Do not eat a lot of foods that are high in solid fats, added sugars, or sodium. Maintain a healthy weight Body mass index (BMI) is used to identify weight problems. It estimates body fat based on height and weight. Your health care provider can help determine your BMI and help you achieve or maintain a healthy weight. Get regular exercise Get regular exercise. This is one of the most important things you can do for your health. Most adults should:  Exercise for at least 150 minutes each week. The exercise should increase your heart rate and make you sweat (moderate-intensity exercise).  Do strengthening exercises at least twice a week. This is in addition to the moderate-intensity exercise.  Spend less time sitting. Even light physical activity can be beneficial. Watch cholesterol and blood lipids Have your blood tested for lipids and cholesterol at 37 years of age, then have this test every 5 years. Have your cholesterol levels checked more often if:  Your lipid or cholesterol levels are high.  You are older than 37 years of age.  You are at high risk for heart disease. What should I know about cancer screening? Depending on your health history and family history, you may need to have cancer screening at various ages. This may include screening for:  Breast cancer.  Cervical cancer.  Colorectal cancer.  Skin cancer.  Lung cancer. What should I know about heart disease, diabetes, and high blood  pressure? Blood pressure and heart disease  High blood pressure causes heart disease and increases the risk of stroke. This is more likely to develop in people who have high blood pressure readings, are of African descent, or are overweight.  Have your blood pressure checked: ? Every 3-5 years if you are 18-39 years of age. ? Every year if you are 40 years old or older. Diabetes Have regular diabetes screenings. This checks your fasting blood sugar level. Have the screening done:  Once every three years after age 40 if you are at a normal weight and have a low risk for diabetes.  More often and at a younger age if you are overweight or have a high risk for diabetes. What should I know about preventing infection? Hepatitis B If you have a higher risk for hepatitis B, you should be screened for this virus. Talk with your health care provider to find out if you are at risk for hepatitis B infection. Hepatitis C Testing is recommended for:  Everyone born from 1945 through 1965.  Anyone with known risk factors for hepatitis C. Sexually transmitted infections (STIs)  Get screened for STIs, including gonorrhea and chlamydia, if: ? You are sexually active and are younger than 37 years of age. ? You are older than 37 years of age and your health care provider tells you that you are at risk for this type of infection. ? Your sexual activity has changed since you were last screened, and you are at increased risk for chlamydia or gonorrhea. Ask your health care provider if   you are at risk.  Ask your health care provider about whether you are at high risk for HIV. Your health care provider may recommend a prescription medicine to help prevent HIV infection. If you choose to take medicine to prevent HIV, you should first get tested for HIV. You should then be tested every 3 months for as long as you are taking the medicine. Pregnancy  If you are about to stop having your period (premenopausal) and  you may become pregnant, seek counseling before you get pregnant.  Take 400 to 800 micrograms (mcg) of folic acid every day if you become pregnant.  Ask for birth control (contraception) if you want to prevent pregnancy. Osteoporosis and menopause Osteoporosis is a disease in which the bones lose minerals and strength with aging. This can result in bone fractures. If you are 65 years old or older, or if you are at risk for osteoporosis and fractures, ask your health care provider if you should:  Be screened for bone loss.  Take a calcium or vitamin D supplement to lower your risk of fractures.  Be given hormone replacement therapy (HRT) to treat symptoms of menopause. Follow these instructions at home: Lifestyle  Do not use any products that contain nicotine or tobacco, such as cigarettes, e-cigarettes, and chewing tobacco. If you need help quitting, ask your health care provider.  Do not use street drugs.  Do not share needles.  Ask your health care provider for help if you need support or information about quitting drugs. Alcohol use  Do not drink alcohol if: ? Your health care provider tells you not to drink. ? You are pregnant, may be pregnant, or are planning to become pregnant.  If you drink alcohol: ? Limit how much you use to 0-1 drink a day. ? Limit intake if you are breastfeeding.  Be aware of how much alcohol is in your drink. In the [place] one drink equals one 12 oz bottle of beer (355 mL), one 5 oz glass of wine (148 mL), or one 1 oz glass of hard liquor (44 mL). General instructions  Schedule regular health, dental, and eye exams.  Stay current with your vaccines.  Tell your health care provider if: ? You often feel depressed. ? You have ever been abused or do not feel safe at home. Summary  Adopting a healthy lifestyle and getting preventive care are important in promoting health and wellness.  Follow your health care provider's instructions about healthy  diet, exercising, and getting tested or screened for diseases.  Follow your health care provider's instructions on monitoring your cholesterol and blood pressure. This information is not intended to replace advice given to you by your health care provider. Make sure you discuss any questions you have with your health care provider. Document Revised: 12/10/2018 Document Reviewed: 12/10/2018 Elsevier Patient Education  2020 [employer]    Breast Self-Awareness Breast self-awareness means being familiar with how your breasts look and feel. It involves checking your breasts regularly and reporting any changes to your health care provider. Practicing breast self-awareness is important. Sometimes changes may not be harmful (are benign), but sometimes a change in your breasts can be a sign of a serious medical problem. It is important to learn how to do this procedure correctly so that you can catch problems early, when treatment is more likely to be successful. All women should practice breast self-awareness, including women who have had breast implants. What you need:  A mirror.  A well-lit   room. How to do a breast self-exam A breast self-exam is one way to learn what is normal for your breasts and whether your breasts are changing. To do a breast self-exam: Look for changes  1. Remove all the clothing above your waist. 2. Stand in front of a mirror in a room with good lighting. 3. Put your hands on your hips. 4. Push your hands firmly downward. 5. Compare your breasts in the mirror. Look for differences between them (asymmetry), such as: ? Differences in shape. ? Differences in size. ? Puckers, dips, and bumps in one breast and not the other. 6. Look at each breast for changes in the skin, such as: ? Redness. ? Scaly areas. 7. Look for changes in your nipples, such as: ? Discharge. ? Bleeding. ? Dimpling. ? Redness. ? A change in position. Feel for changes Carefully feel your  breasts for lumps and changes. It is best to do this while lying on your back on the floor, and again while sitting or standing in the tub or shower with soapy water on your skin. Feel each breast in the following way: 1. Place the arm on the side of the breast you are examining above your head. 2. Feel your breast with the other hand. 3. Start in the nipple area and make -inch (2 cm) overlapping circles to feel your breast. Use the pads of your three middle fingers to do this. Apply light pressure, then medium pressure, then firm pressure. The light pressure will allow you to feel the tissue closest to the skin. The medium pressure will allow you to feel the tissue that is a little deeper. The firm pressure will allow you to feel the tissue close to the ribs. 4. Continue the overlapping circles, moving downward over the breast until you feel your ribs below your breast. 5. Move one finger-width toward the center of the body. Continue to use the -inch (2 cm) overlapping circles to feel your breast as you move slowly up toward your collarbone. 6. Continue the up-and-down exam using all three pressures until you reach your armpit.  Write down what you find Writing down what you find can help you remember what to discuss with your health care provider. Write down:  What is normal for each breast.  Any changes that you find in each breast, including: ? The kind of changes you find. ? Any pain or tenderness. ? Size and location of any lumps.  Where you are in your menstrual cycle, if you are still menstruating. General tips and recommendations  Examine your breasts every month.  If you are breastfeeding, the best time to examine your breasts is after a feeding or after using a breast pump.  If you menstruate, the best time to examine your breasts is 5-7 days after your period. Breasts are generally lumpier during menstrual periods, and it may be more difficult to notice changes.  With time  and practice, you will become more familiar with the variations in your breasts and more comfortable with the exam. Contact a health care provider if you:  See a change in the shape or size of your breasts or nipples.  See a change in the skin of your breast or nipples, such as a reddened or scaly area.  Have unusual discharge from your nipples.  Find a lump or thick area that was not there before.  Have pain in your breasts.  Have any concerns related to your breast health. Summary  Breast   self-awareness includes looking for physical changes in your breasts, as well as feeling for any changes within your breasts.  Breast self-awareness should be performed in front of a mirror in a well-lit room.  You should examine your breasts every month. If you menstruate, the best time to examine your breasts is 5-7 days after your menstrual period.  Let your health care provider know of any changes you notice in your breasts, including changes in size, changes on the skin, pain or tenderness, or unusual fluid from your nipples. This information is not intended to replace advice given to you by your health care provider. Make sure you discuss any questions you have with your health care provider. Document Revised: 08/05/2018 Document Reviewed: 08/05/2018 Elsevier Patient Education  2020 [employer] Inc.  

## 2020-01-08 LAB — CYTOLOGY - PAP
Comment: NEGATIVE
Diagnosis: NEGATIVE
High risk HPV: NEGATIVE

## 2020-05-03 ENCOUNTER — Other Ambulatory Visit: Payer: Self-pay

## 2020-05-03 ENCOUNTER — Encounter: Payer: Self-pay | Admitting: Nurse Practitioner

## 2020-05-03 ENCOUNTER — Ambulatory Visit: Payer: No Typology Code available for payment source | Admitting: Nurse Practitioner

## 2020-05-03 VITALS — BP 116/80 | HR 71 | Temp 98.0°F | Ht 69.0 in | Wt 175.4 lb

## 2020-05-03 DIAGNOSIS — Z0000 Encounter for general adult medical examination without abnormal findings: Secondary | ICD-10-CM

## 2020-05-03 DIAGNOSIS — Z020 Encounter for examination for admission to educational institution: Secondary | ICD-10-CM

## 2020-05-03 LAB — URINALYSIS, ROUTINE W REFLEX MICROSCOPIC
Bilirubin Urine: NEGATIVE
Hgb urine dipstick: NEGATIVE
Ketones, ur: NEGATIVE
Leukocytes,Ua: NEGATIVE
Nitrite: NEGATIVE
Specific Gravity, Urine: 1.02 (ref 1.000–1.030)
Total Protein, Urine: NEGATIVE
Urine Glucose: NEGATIVE
Urobilinogen, UA: 0.2 (ref 0.0–1.0)
pH: 7 (ref 5.0–8.0)

## 2020-05-03 NOTE — Progress Notes (Signed)
Established Patient Office Visit  Subjective:  Patient ID: Roberta Wagner, female    DOB: 1981/09/11  Age: 39 y.o. MRN: [id number]  CC:  Chief Complaint  Patient presents with  . Transitions Of Care    needs forms filled out and injections    HPI Roberta Wagner is a healthy 39 yo pregnant female who presents to establish care with a new provider. She was  accepted into nursing school at [employer] and needs a physical exam and immunization booster labs drawn. 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  She is [redacted] weeks pregnant and is on prenatal vitamins.  She has no healthcare concerns at this time.  She is followed by Dr. Valentino Saxon at [facility].  Patient has 3 young children at home.  She has a supportive husband.  She stays very busy.  She is also an athlete, has ran track, and works out at [employer]       Past Medical History:  Diagnosis Date  . Abnormal Pap smear of vagina 2012   colpo neg  . Kidney stone   . Vitamin D deficiency     Past Surgical History:  Procedure Laterality Date  . CESAREAN SECTION  2014  . kidney stone remove  2016    Family History  Problem Relation Age of Onset  . Hypertension Mother   . Stroke Father   . Cancer Maternal Grandfather   . Diabetes Paternal Grandmother   . Breast cancer Paternal Grandmother   . Cancer Paternal Grandfather     Social History   Socioeconomic History  . Marital status: Married    Spouse name: Not on file  . Number of children: Not on file  . Years of education: Not on file  . Highest education level: Not on file  Occupational History  . Not on file  Tobacco Use  . Smoking status: Never Smoker  . Smokeless tobacco: Never Used  Substance and Sexual Activity  . Alcohol use: Not Currently  . Drug use: No  . Sexual activity: Yes    Birth control/protection: None  Other Topics Concern  . Not on file  Social History Narrative  . Not on file   Social Determinants of Health   Financial Resource Strain:   .  Difficulty of Paying Living Expenses:   Food Insecurity:   . Worried About Programme researcher, broadcasting/film/video in the Last Year:   . Barista in the Last Year:   Transportation Needs:   . Freight forwarder (Medical):   Marland Kitchen Lack of Transportation (Non-Medical):   Physical Activity:   . Days of Exercise per Week:   . Minutes of Exercise per Session:   Stress:   . Feeling of Stress :   Social Connections:   . Frequency of Communication with Friends and Family:   . Frequency of Social Gatherings with Friends and Family:   . Attends Religious Services:   . Active Member of Clubs or Organizations:   . Attends Banker Meetings:   Marland Kitchen Marital Status:   Intimate Partner Violence:   . Fear of Current or Ex-Partner:   . Emotionally Abused:   Marland Kitchen Physically Abused:   . Sexually Abused:     Outpatient Medications Prior to Visit  Medication Sig Dispense Refill  . Multiple Vitamin (MULTI-VITAMIN DAILY) TABS Take by mouth.    . prenatal vitamin w/FE, FA (PRENATAL 1 + 1) 27-1 MG TABS tablet Take 1  tablet by mouth daily at 12 noon.    . Vitamin D, Ergocalciferol, (DRISDOL) 1.25 MG (50000 UT) CAPS capsule Take 50,000 Units by mouth once a week.     No facility-administered medications prior to visit.    Allergies  Allergen Reactions  . Sulfa Antibiotics Hives    ROS Review of Systems  Constitutional: Negative.   HENT: Negative.   Eyes: Negative.   Respiratory: Negative.   Cardiovascular: Negative.   Gastrointestinal: Negative.   Endocrine: Negative.   Genitourinary: Negative.   Musculoskeletal: Negative.   Skin: Negative.   Allergic/Immunologic: Negative.   Neurological: Negative.   Hematological: Negative.   Psychiatric/Behavioral:       No depression or anxiety concerns. No problem with post partum depression with other 3 children.  PHQ-9: 0, GAD-7: 0      Objective:    Physical Exam  Constitutional: She is oriented to person, place, and time. She appears  well-developed and well-nourished.  HENT:  Head: Normocephalic and atraumatic.  Right Ear: External ear normal.  Left Ear: External ear normal.  Eyes: Pupils are equal, round, and reactive to light. Conjunctivae are normal.  Cardiovascular: Normal rate, regular rhythm and normal heart sounds.  Pulmonary/Chest: Effort normal and breath sounds normal.  Abdominal: Soft. Bowel sounds are normal.  Musculoskeletal:        General: Normal range of motion.     Cervical back: Normal range of motion and neck supple.  Neurological: She is alert and oriented to person, place, and time.  Skin: Skin is warm and dry.  Psychiatric: She has a normal mood and affect. Her behavior is normal. Judgment and thought content normal.  Vitals reviewed.   BP 116/80 (BP Location: Left Arm, Patient Position: Sitting, Cuff Size: Small)   Pulse 71   Temp 98 F (36.7 C) (Skin)   Ht 5\' 9"  (1.753 m)   Wt 175 lb 6.4 oz (79.6 kg)   SpO2 98%   BMI 25.90 kg/m  Wt Readings from Last 3 Encounters:  05/04/20 170 lb (77.1 kg)  05/04/20 178 lb 8 oz (81 kg)  05/03/20 175 lb 6.4 oz (79.6 kg)    There are no preventive care reminders to display for this patient.  There are no preventive care reminders to display for this patient.  Lab Results  Component Value Date   TSH 0.47 09/09/2018   Lab Results  Component Value Date   WBC 5.8 05/04/2020   HGB 14.0 05/04/2020   HCT 40.0 05/04/2020   MCV 85.5 05/04/2020   PLT 257 05/04/2020   Lab Results  Component Value Date   NA 136 05/04/2020   K 3.9 05/04/2020   CO2 23 05/04/2020   GLUCOSE 108 (H) 05/04/2020   BUN 12 05/04/2020   CREATININE 0.92 05/04/2020   BILITOT 0.8 09/09/2018   ALKPHOS 36 (L) 09/09/2018   AST 24 09/09/2018   ALT 22 09/09/2018   PROT 7.0 09/09/2018   ALBUMIN 4.2 09/09/2018   CALCIUM 9.2 05/04/2020   ANIONGAP 6 05/04/2020   GFR 84.07 09/09/2018     Assessment & Plan:   Problem List Items Addressed This Visit    None    Visit  Diagnoses    School health examination    -  Primary   Relevant Orders   Urinalysis, Routine w reflex microscopic (Completed)   CBC With Differential (Completed)   Measles/Mumps/Rubella Immunity (Completed)   Hepatitis A Ab, Total (Completed)   Hepatitis B surface antibody,quantitative (Completed)  Varicella zoster antibody, IgG (Completed)     The patient is [redacted] weeks pregnant and is doing very well.  Her health exam is unremarkable.  We have sent her for the required lab work regarding her nursing school physical requirements.  She is followed actively by GYN now.  We can see her back next year for annual well woman exam.    No orders of the defined types were placed in this encounter.   Follow-up: Return in about 1 year (around 05/03/2021).  This visit occurred during the SARS-CoV-2 public health emergency.  Safety protocols were in place, including screening questions prior to the visit, additional usage of staff PPE, and extensive cleaning of exam room while observing appropriate contact time as indicated for disinfecting solutions.    Amedeo Kinsman, NP

## 2020-05-03 NOTE — Patient Instructions (Addendum)
Prenatal Care Prenatal care is health care during pregnancy. It helps you and your unborn baby (fetus) stay as healthy as possible. Prenatal care may be provided by a midwife, a family practice health care provider, or a childbirth and pregnancy specialist (obstetrician). How does this affect me? During pregnancy, you will be closely monitored for any new conditions that might develop. To lower your risk of pregnancy complications, you and your health care provider will talk about any underlying conditions you have. How does this affect my baby? Early and consistent prenatal care increases the chance that your baby will be healthy during pregnancy. Prenatal care lowers the risk that your baby will be:  Born early (prematurely).  Smaller than expected at birth (small for gestational age). What can I expect at the first prenatal care visit? Your first prenatal care visit will likely be the longest. You should schedule your first prenatal care visit as soon as you know that you are pregnant. Your first visit is a good time to talk about any questions or concerns you have about pregnancy. At your visit, you and your health care provider will talk about:  Your medical history, including: ? Any past pregnancies. ? Your family's medical history. ? The baby's father's medical history. ? Any long-term (chronic) health conditions you have and how you manage them. ? Any surgeries or procedures you have had. ? Any current over-the-counter or prescription medicines, herbs, or supplements you are taking.  Other factors that could pose a risk to your baby, including:  Your home setting and your stress levels, including: ? Exposure to abuse or violence. ? Household financial strain. ? Mental health conditions you have.  Your daily health habits, including diet and exercise. Your health care provider will also:  Measure your weight, height, and blood pressure.  Do a physical exam, including a pelvic  and breast exam.  Perform blood tests and urine tests to check for: ? Urinary tract infection. ? Sexually transmitted infections (STIs). ? Low iron levels in your blood (anemia). ? Blood type and certain proteins on red blood cells (Rh antibodies). ? Infections and immunity to viruses, such as hepatitis B and rubella. ? HIV (human immunodeficiency virus).  Do an ultrasound to confirm your baby's growth and development and to help predict your estimated due date (EDD). This ultrasound is done with a probe that is inserted into the vagina (transvaginal ultrasound).  Discuss your options for genetic screening.  Give you information about how to keep yourself and your baby healthy, including: ? Nutrition and taking vitamins. ? Physical activity. ? How to manage pregnancy symptoms such as nausea and vomiting (morning sickness). ? Infections and substances that may be harmful to your baby and how to avoid them. ? Food safety. ? Dental care. ? Working. ? Travel. ? Warning signs to watch for and when to call your health care provider. How often will I have prenatal care visits? After your first prenatal care visit, you will have regular visits throughout your pregnancy. The visit schedule is often as follows:  Up to week 28 of pregnancy: once every 4 weeks.  28-36 weeks: once every 2 weeks.  After 36 weeks: every week until delivery. Some women may have visits more or less often depending on any underlying health conditions and the health of the baby. Keep all follow-up and prenatal care visits as told by your health care provider. This is important. What happens during routine prenatal care visits? Your health care provider will:  Measure your weight and blood pressure.  Check for fetal heart sounds.  Measure the height of your uterus in your abdomen (fundal height). This may be measured starting around week 20 of pregnancy.  Check the position of your baby inside your  uterus.  Ask questions about your diet, sleeping patterns, and whether you can feel the baby move.  Review warning signs to watch for and signs of labor.  Ask about any pregnancy symptoms you are having and how you are dealing with them. Symptoms may include: ? Headaches. ? Nausea and vomiting. ? Vaginal discharge. ? Swelling. ? Fatigue. ? Constipation. ? Any discomfort, including back or pelvic pain. Make a list of questions to ask your health care provider at your routine visits. What tests might I have during prenatal care visits? You may have blood, urine, and imaging tests throughout your pregnancy, such as:  Urine tests to check for glucose, protein, or signs of infection.  Glucose tests to check for a form of diabetes that can develop during pregnancy (gestational diabetes mellitus). This is usually done around week 24 of pregnancy.  An ultrasound to check your baby's growth and development and to check for birth defects. This is usually done around week 20 of pregnancy.  A test to check for group B strep (GBS) infection. This is usually done around week 36 of pregnancy.  Genetic testing. This may include blood or imaging tests, such as an ultrasound. Some genetic tests are done during the first trimester and some are done during the second trimester. What else can I expect during prenatal care visits? Your health care provider may recommend getting certain vaccines during pregnancy. These may include:  A yearly flu shot (annual influenza vaccine). This is especially important if you will be pregnant during flu season.  Tdap (tetanus, diphtheria, pertussis) vaccine. Getting this vaccine during pregnancy can protect your baby from whooping cough (pertussis) after birth. This vaccine may be recommended between weeks 27 and 36 of pregnancy. Later in your pregnancy, your health care provider may give you information about:  Childbirth and breastfeeding classes.  Choosing a  health care provider for your baby.  Umbilical cord banking.  Breastfeeding.  Birth control after your baby is born.  The hospital labor and delivery unit and how to tour it.  Registering at the hospital before you go into labor. Where to find more information  Office on Women's Health: LegalWarrants.gl  American Pregnancy Association: americanpregnancy.org  March of Dimes: marchofdimes.org Summary  Prenatal care helps you and your baby stay as healthy as possible during pregnancy.  Your first prenatal care visit will most likely be the longest.  You will have visits and tests throughout your pregnancy to monitor your health and your baby's health.  Bring a list of questions to your visits to ask your health care provider.  Make sure to keep all follow-up and prenatal care visits with your health care provider. This information is not intended to replace advice given to you by your health care provider. Make sure you discuss any questions you have with your health care provider. Document Revised: 04/08/2019 Document Reviewed: 12/16/2017 Elsevier Patient Education  [place]. Common Medications Safe in Pregnancy  Acne:      Constipation:  Benzoyl Peroxide     Colace  Clindamycin      Dulcolax Suppository  Topica Erythromycin     Fibercon  Salicylic Acid      Metamucil  Miralax AVOID:        Senakot   Accutane    Cough:  Retin-A       Cough Drops  Tetracycline      Phenergan w/ Codeine if Rx  Minocycline      Robitussin (Plain & DM)  Antibiotics:     Crabs/Lice:  Ceclor       RID  Cephalosporins    AVOID:  E-Mycins      Kwell  Keflex  Macrobid/Macrodantin   Diarrhea:  Penicillin      Kao-Pectate  Zithromax      Imodium AD         PUSH FLUIDS AVOID:       Cipro     Fever:  Tetracycline      Tylenol (Regular or Extra  Minocycline       Strength)  Levaquin      Extra Strength-Do not          Exceed 8 tabs/24 hrs Caffeine:        <239m/day  (equiv. To 1 cup of coffee or  approx. 3 12 oz sodas)         Gas: Cold/Hayfever:       Gas-X  Benadryl      Mylicon  Claritin       Phazyme  **Claritin-D        Chlor-Trimeton    Headaches:  Dimetapp      ASA-Free Excedrin  Drixoral-Non-Drowsy     Cold Compress  Mucinex (Guaifenasin)     Tylenol (Regular or Extra  Sudafed/Sudafed-12 Hour     Strength)  **Sudafed PE Pseudoephedrine   Tylenol Cold & Sinus     Vicks Vapor Rub  Zyrtec  **AVOID if Problems With Blood Pressure         Heartburn: Avoid lying down for at least 1 hour after meals  Aciphex      Maalox     Rash:  Milk of Magnesia     Benadryl    Mylanta       1% Hydrocortisone Cream  Pepcid  Pepcid Complete   Sleep Aids:  Prevacid      Ambien   Prilosec       Benadryl  Rolaids       Chamomile Tea  Tums (Limit 4/day)     Unisom  Zantac       Tylenol PM         Warm milk-add vanilla or  Hemorrhoids:       Sugar for taste  Anusol/Anusol H.C.  (RX: Analapram 2.5%)  Sugar Substitutes:  Hydrocortisone OTC     Ok in moderation  Preparation H      Tucks        Vaseline lotion applied to tissue with wiping    Herpes:     Throat:  Acyclovir      Oragel  Famvir  Valtrex     Vaccines:         Flu Shot Leg Cramps:       *Gardasil  Benadryl      Hepatitis A         Hepatitis B Nasal Spray:       Pneumovax  Saline Nasal Spray     Polio Booster         Tetanus Nausea:       Tuberculosis test or PPD  Vitamin B6 25 mg TID   AVOID:    Dramamine      *  Gardasil  Emetrol       Live Poliovirus  Ginger Root 250 mg QID    MMR (measles, mumps &  High Complex Carbs @ Bedtime    rebella)  Sea Bands-Accupressure    Varicella (Chickenpox)  Unisom 1/2 tab TID     *No known complications           If received before Pain:         Known pregnancy;   Darvocet       Resume series after  Lortab        Delivery  Percocet    Yeast:   Tramadol      Femstat  Tylenol  3      Gyne-lotrimin  Ultram       Monistat  Vicodin           MISC:         All Sunscreens           Hair Coloring/highlights          Insect Repellant's          (Including DEET)         Mystic Tans  First Trimester of Pregnancy  The first trimester of pregnancy is from week 1 until the end of week 13 (months 1 through 3). During this time, your baby will begin to develop inside you. At 6-8 weeks, the eyes and face are formed, and the heartbeat can be seen on ultrasound. At the end of 12 weeks, all the baby's organs are formed. Prenatal care is all the medical care you receive before the birth of your baby. Make sure you get good prenatal care and follow all of your doctor's instructions. Follow these instructions at home: Medicines  Take over-the-counter and prescription medicines only as told by your doctor. Some medicines are safe and some medicines are not safe during pregnancy.  Take a prenatal vitamin that contains at least 600 micrograms (mcg) of folic acid.  If you have trouble pooping (constipation), take medicine that will make your stool soft (stool softener) if your doctor approves. Eating and drinking   Eat regular, healthy meals.  Your doctor will tell you the amount of weight gain that is right for you.  Avoid raw meat and uncooked cheese.  If you feel sick to your stomach (nauseous) or throw up (vomit): ? Eat 4 or 5 small meals a day instead of 3 large meals. ? Try eating a few soda crackers. ? Drink liquids between meals instead of during meals.  To prevent constipation: ? Eat foods that are high in fiber, like fresh fruits and vegetables, whole grains, and beans. ? Drink enough fluids to keep your pee (urine) clear or pale yellow. Activity  Exercise only as told by your doctor. Stop exercising if you have cramps or pain in your lower belly (abdomen) or low back.  Do not exercise if it is too hot, too humid, or if you are in a place of great height (high  altitude).  Try to avoid standing for long periods of time. Move your legs often if you must stand in one place for a long time.  Avoid heavy lifting.  Wear low-heeled shoes. Sit and stand up straight.  You can have sex unless your doctor tells you not to. Relieving pain and discomfort  Wear a good support bra if your breasts are sore.  Take warm water baths (sitz baths) to soothe pain or discomfort caused by hemorrhoids. Use  hemorrhoid cream if your doctor says it is okay.  Rest with your legs raised if you have leg cramps or low back pain.  If you have puffy, bulging veins (varicose veins) in your legs: ? Wear support hose or compression stockings as told by your doctor. ? Raise (elevate) your feet for 15 minutes, 3-4 times a day. ? Limit salt in your food. Prenatal care  Schedule your prenatal visits by the twelfth week of pregnancy.  Write down your questions. Take them to your prenatal visits.  Keep all your prenatal visits as told by your doctor. This is important. Safety  Wear your seat belt at all times when driving.  Make a list of emergency phone numbers. The list should include numbers for family, friends, the hospital, and police and fire departments. General instructions  Ask your doctor for a referral to a local prenatal class. Begin classes no later than at the start of month 6 of your pregnancy.  Ask for help if you need counseling or if you need help with nutrition. Your doctor can give you advice or tell you where to go for help.  Do not use hot tubs, steam rooms, or saunas.  Do not douche or use tampons or scented sanitary pads.  Do not cross your legs for long periods of time.  Avoid all herbs and alcohol. Avoid drugs that are not approved by your doctor.  Do not use any tobacco products, including cigarettes, chewing tobacco, and electronic cigarettes. If you need help quitting, ask your doctor. You may get counseling or other support to help you  quit.  Avoid cat litter boxes and soil used by cats. These carry germs that can cause birth defects in the baby and can cause a loss of your baby (miscarriage) or stillbirth.  Visit your dentist. At home, brush your teeth with a soft toothbrush. Be gentle when you floss. Contact a doctor if:  You are dizzy.  You have mild cramps or pressure in your lower belly.  You have a nagging pain in your belly area.  You continue to feel sick to your stomach, you throw up, or you have watery poop (diarrhea).  You have a bad smelling fluid coming from your vagina.  You have pain when you pee (urinate).  You have increased puffiness (swelling) in your face, hands, legs, or ankles. Get help right away if:  You have a fever.  You are leaking fluid from your vagina.  You have spotting or bleeding from your vagina.  You have very bad belly cramping or pain.  You gain or lose weight rapidly.  You throw up blood. It may look like coffee grounds.  You are around people who have [place] measles, fifth disease, or chickenpox.  You have a very bad headache.  You have shortness of breath.  You have any kind of trauma, such as from a fall or a car accident. Summary  The first trimester of pregnancy is from week 1 until the end of week 13 (months 1 through 3).  To take care of yourself and your unborn baby, you will need to eat healthy meals, take medicines only if your doctor tells you to do so, and do activities that are safe for you and your baby.  Keep all follow-up visits as told by your doctor. This is important as your doctor will have to ensure that your baby is healthy and growing well. This information is not intended to replace advice given to you  by your health care provider. Make sure you discuss any questions you have with your health care provider. Document Revised: 04/09/2019 Document Reviewed: 12/25/2016 Elsevier Patient Education  [place] of  Pregnancy  The second trimester is from week 14 through week 27 (month 4 through 6). This is often the time in pregnancy that you feel your best. Often times, morning sickness has lessened or quit. You may have more energy, and you may get hungry more often. Your unborn baby is growing rapidly. At the end of the sixth month, he or she is about 9 inches long and weighs about 1 pounds. You will likely feel the baby move between 18 and 20 weeks of pregnancy. Follow these instructions at home: Medicines  Take over-the-counter and prescription medicines only as told by your doctor. Some medicines are safe and some medicines are not safe during pregnancy.  Take a prenatal vitamin that contains at least 600 micrograms (mcg) of folic acid.  If you have trouble pooping (constipation), take medicine that will make your stool soft (stool softener) if your doctor approves. Eating and drinking   Eat regular, healthy meals.  Avoid raw meat and uncooked cheese.  If you get low calcium from the food you eat, talk to your doctor about taking a daily calcium supplement.  Avoid foods that are high in fat and sugars, such as fried and sweet foods.  If you feel sick to your stomach (nauseous) or throw up (vomit): ? Eat 4 or 5 small meals a day instead of 3 large meals. ? Try eating a few soda crackers. ? Drink liquids between meals instead of during meals.  To prevent constipation: ? Eat foods that are high in fiber, like fresh fruits and vegetables, whole grains, and beans. ? Drink enough fluids to keep your pee (urine) clear or pale yellow. Activity  Exercise only as told by your doctor. Stop exercising if you start to have cramps.  Do not exercise if it is too hot, too humid, or if you are in a place of great height (high altitude).  Avoid heavy lifting.  Wear low-heeled shoes. Sit and stand up straight.  You can continue to have sex unless your doctor tells you not to. Relieving pain and  discomfort  Wear a good support bra if your breasts are tender.  Take warm water baths (sitz baths) to soothe pain or discomfort caused by hemorrhoids. Use hemorrhoid cream if your doctor approves.  Rest with your legs raised if you have leg cramps or low back pain.  If you develop puffy, bulging veins (varicose veins) in your legs: ? Wear support hose or compression stockings as told by your doctor. ? Raise (elevate) your feet for 15 minutes, 3-4 times a day. ? Limit salt in your food. Prenatal care  Write down your questions. Take them to your prenatal visits.  Keep all your prenatal visits as told by your doctor. This is important. Safety  Wear your seat belt when driving.  Make a list of emergency phone numbers, including numbers for family, friends, the hospital, and police and fire departments. General instructions  Ask your doctor about the right foods to eat or for help finding a counselor, if you need these services.  Ask your doctor about local prenatal classes. Begin classes before month 6 of your pregnancy.  Do not use hot tubs, steam rooms, or saunas.  Do not douche or use tampons or scented sanitary pads.  Do not  cross your legs for long periods of time.  Visit your dentist if you have not done so. Use a soft toothbrush to brush your teeth. Floss gently.  Avoid all smoking, herbs, and alcohol. Avoid drugs that are not approved by your doctor.  Do not use any products that contain nicotine or tobacco, such as cigarettes and e-cigarettes. If you need help quitting, ask your doctor.  Avoid cat litter boxes and soil used by cats. These carry germs that can cause birth defects in the baby and can cause a loss of your baby (miscarriage) or stillbirth. Contact a doctor if:  You have mild cramps or pressure in your lower belly.  You have pain when you pee (urinate).  You have bad smelling fluid coming from your vagina.  You continue to feel sick to your stomach  (nauseous), throw up (vomit), or have watery poop (diarrhea).  You have a nagging pain in your belly area.  You feel dizzy. Get help right away if:  You have a fever.  You are leaking fluid from your vagina.  You have spotting or bleeding from your vagina.  You have severe belly cramping or pain.  You lose or gain weight rapidly.  You have trouble catching your breath and have chest pain.  You notice sudden or extreme puffiness (swelling) of your face, hands, ankles, feet, or legs.  You have not felt the baby move in over an hour.  You have severe headaches that do not go away when you take medicine.  You have trouble seeing. Summary  The second trimester is from week 14 through week 27 (months 4 through 6). This is often the time in pregnancy that you feel your best.  To take care of yourself and your unborn baby, you will need to eat healthy meals, take medicines only if your doctor tells you to do so, and do activities that are safe for you and your baby.  Call your doctor if you get sick or if you notice anything unusual about your pregnancy. Also, call your doctor if you need help with the right food to eat, or if you want to know what activities are safe for you. This information is not intended to replace advice given to you by your health care provider. Make sure you discuss any questions you have with your health care provider. Document Revised: 04/10/2019 Document Reviewed: 01/22/2017 Elsevier Patient Education  [place].

## 2020-05-03 NOTE — Progress Notes (Signed)
PT is present today for confirmation of pregnancy. Pt LMP 03/24/2020. UPT done today results were . Pt stated that she is doing well no complaints.

## 2020-05-04 ENCOUNTER — Encounter: Payer: Self-pay | Admitting: Obstetrics and Gynecology

## 2020-05-04 ENCOUNTER — Encounter: Payer: Self-pay | Admitting: Nurse Practitioner

## 2020-05-04 ENCOUNTER — Emergency Department: Payer: No Typology Code available for payment source

## 2020-05-04 ENCOUNTER — Other Ambulatory Visit: Payer: Self-pay

## 2020-05-04 ENCOUNTER — Emergency Department
Admission: EM | Admit: 2020-05-04 | Discharge: 2020-05-04 | Disposition: A | Discharge status: Home / Self Care | Payer: No Typology Code available for payment source | Attending: Emergency Medicine | Admitting: Emergency Medicine

## 2020-05-04 ENCOUNTER — Ambulatory Visit (INDEPENDENT_AMBULATORY_CARE_PROVIDER_SITE_OTHER): Payer: No Typology Code available for payment source | Admitting: Obstetrics and Gynecology

## 2020-05-04 VITALS — BP 133/75 | HR 69 | Ht 69.0 in | Wt 178.5 lb

## 2020-05-04 DIAGNOSIS — Z3A01 Less than 8 weeks gestation of pregnancy: Secondary | ICD-10-CM | POA: Diagnosis not present

## 2020-05-04 DIAGNOSIS — N926 Irregular menstruation, unspecified: Secondary | ICD-10-CM

## 2020-05-04 DIAGNOSIS — O26891 Other specified pregnancy related conditions, first trimester: Secondary | ICD-10-CM | POA: Insufficient documentation

## 2020-05-04 DIAGNOSIS — Z87442 Personal history of urinary calculi: Secondary | ICD-10-CM | POA: Insufficient documentation

## 2020-05-04 DIAGNOSIS — R102 Pelvic and perineal pain: Secondary | ICD-10-CM | POA: Insufficient documentation

## 2020-05-04 DIAGNOSIS — R109 Unspecified abdominal pain: Secondary | ICD-10-CM

## 2020-05-04 LAB — CBC WITH DIFFERENTIAL/PLATELET
Abs Immature Granulocytes: 0.02 10*3/uL (ref 0.00–0.07)
Basophils Absolute: 0 10*3/uL (ref 0.0–0.1)
Basophils Relative: 1 %
Eosinophils Absolute: 0.3 10*3/uL (ref 0.0–0.5)
Eosinophils Relative: 5 %
HCT: 40 % (ref 36.0–46.0)
Hemoglobin: 14 g/dL (ref 12.0–15.0)
Immature Granulocytes: 0 %
Lymphocytes Relative: 38 %
Lymphs Abs: 2.2 10*3/uL (ref 0.7–4.0)
MCH: 29.9 pg (ref 26.0–34.0)
MCHC: 35 g/dL (ref 30.0–36.0)
MCV: 85.5 fL (ref 80.0–100.0)
Monocytes Absolute: 0.5 10*3/uL (ref 0.1–1.0)
Monocytes Relative: 8 %
Neutro Abs: 2.8 10*3/uL (ref 1.7–7.7)
Neutrophils Relative %: 48 %
Platelets: 257 10*3/uL (ref 150–400)
RBC: 4.68 MIL/uL (ref 3.87–5.11)
RDW: 12.2 % (ref 11.5–15.5)
WBC: 5.8 10*3/uL (ref 4.0–10.5)
nRBC: 0 % (ref 0.0–0.2)

## 2020-05-04 LAB — BASIC METABOLIC PANEL
Anion gap: 6 (ref 5–15)
BUN: 12 mg/dL (ref 6–20)
CO2: 23 mmol/L (ref 22–32)
Calcium: 9.2 mg/dL (ref 8.9–10.3)
Chloride: 107 mmol/L (ref 98–111)
Creatinine, Ser: 0.92 mg/dL (ref 0.44–1.00)
GFR calc Af Amer: >60 mL/min (ref >60)
GFR calc non Af Amer: >60 mL/min (ref >60)
Glucose, Bld: 108 mg/dL — ABNORMAL HIGH (ref 70–99)
Potassium: 3.9 mmol/L (ref 3.5–5.1)
Sodium: 136 mmol/L (ref 135–145)

## 2020-05-04 LAB — MEASLES/MUMPS/RUBELLA IMMUNITY
Mumps IgG: >300 AU/mL
Rubella: 13 Index
Rubeola IgG: >300 AU/mL

## 2020-05-04 LAB — POCT URINE PREGNANCY: Preg Test, Ur: POSITIVE — AB

## 2020-05-04 LAB — CBC WITH DIFFERENTIAL
Basophils Absolute: 0 10*3/uL (ref 0.0–0.2)
Basos: 1 %
EOS (ABSOLUTE): 0.2 10*3/uL (ref 0.0–0.4)
Eos: 5 %
Hematocrit: 41.8 % (ref 34.0–46.6)
Hemoglobin: 14.2 g/dL (ref 11.1–15.9)
Immature Grans (Abs): 0 10*3/uL (ref 0.0–0.1)
Immature Granulocytes: 0 %
Lymphocytes Absolute: 2.1 10*3/uL (ref 0.7–3.1)
Lymphs: 40 %
MCH: 30.1 pg (ref 26.6–33.0)
MCHC: 34 g/dL (ref 31.5–35.7)
MCV: 89 fL (ref 79–97)
Monocytes Absolute: 0.4 10*3/uL (ref 0.1–0.9)
Monocytes: 7 %
Neutrophils Absolute: 2.5 10*3/uL (ref 1.4–7.0)
Neutrophils: 47 %
RBC: 4.72 x10E6/uL (ref 3.77–5.28)
RDW: 12.5 % (ref 11.7–15.4)
WBC: 5.3 10*3/uL (ref 3.4–10.8)

## 2020-05-04 LAB — HEPATITIS A ANTIBODY, TOTAL: Hepatitis A AB,Total: REACTIVE — AB

## 2020-05-04 LAB — VARICELLA ZOSTER ANTIBODY, IGG: Varicella IgG: 2098 index

## 2020-05-04 LAB — HCG, QUANTITATIVE, PREGNANCY: hCG, Beta Chain, Quant, S: 644 m[IU]/mL — ABNORMAL HIGH (ref <5)

## 2020-05-04 LAB — HEPATITIS B SURFACE ANTIBODY, QUANTITATIVE: Hep B S AB Quant (Post): >1000 m[IU]/mL (ref >=10)

## 2020-05-04 LAB — ABO/RH: ABO/RH(D): O POS

## 2020-05-04 NOTE — Discharge Instructions (Addendum)
Please return to the ER immediately if your abdominal pain gets worse..  See your gynecologist within the next 2 weeks to have a repeat ultrasound.

## 2020-05-04 NOTE — ED Triage Notes (Signed)
Pt to the er for llq pain. Pt is [redacted] weeks pregnant. Pt has some dark brown d/c with wiping. Pt has no hx of ectopic. Pt has a hx of cysts but nothing severe. Pt describes cramping on the llq.

## 2020-05-04 NOTE — Progress Notes (Signed)
    GYNECOLOGY CLINIC PROGRESS NOTE Subjective:    Roberta Wagner is a 39 y.o. [phone] female who presents for evaluation of amenorrhea. She believes she could be pregnant. Pregnancy is desired. Sexual Activity: single partner, contraception: none. Current symptoms also include: positive home pregnancy test and pelvic cramping. Last period was normal.   Patient's last menstrual period was 03/24/2020.    The following portions of the patient's history were reviewed and updated as appropriate: allergies, current medications, past family history, past medical history, past social history, past surgical history and problem list.  Review of Systems Pertinent items noted in HPI and remainder of comprehensive ROS otherwise negative.     Objective:    BP 133/75   Pulse 69   Ht 5\' 9"  (1.753 m)   Wt 178 lb 8 oz (81 kg)   LMP 03/24/2020   BMI 26.36 kg/m  General: alert, no distress and no acute distress    Lab Review Urine HCG: positive    Assessment:    Absence of menstruation.     Plan:    Pregnancy Test: Positive: EDC: 12/29/2020, EGA 5.6 weeks. Briefly discussed pre-natal care options. Pregnancy, Childbirth and the Newborn book given. Encouraged well-balanced diet, plenty of rest when needed, pre-natal vitamins daily and walking for exercise. Discussed self-help for nausea, avoiding OTC medications until consulting provider or pharmacist, other than Tylenol as needed, minimal caffeine (1-2 cups daily) and avoiding alcohol. She will schedule her initial OB visit in the next month with her PCP or OB provider. Feel free to call with any questions.     12/31/2020, MD [facility]

## 2020-05-04 NOTE — ED Provider Notes (Signed)
[facility] Emergency Department Provider Note  ____________________________________________  Time seen: Approximately 9:41 PM  I have reviewed the triage vital signs and the nursing notes.   HISTORY  Chief Complaint Abdominal Pain    HPI Roberta Wagner is a 39 y.o. female presents to the emergency department for treatment and evaluation of left pelvic pain.  She is approximately [redacted] weeks pregnant.  Pain started yesterday afternoon and has been intermittent.   She did have an evaluation by gynecology today and all was well until afterward.  Pain started again and the right pelvic area and she noticed that upon wiping she had a scant amount of dark red substance.  No frank bright red vaginal bleeding.  She denies nausea or vomiting.  She denies vaginal discharge.  Past Medical History:  Diagnosis Date  . Abnormal Pap smear of vagina 2012   colpo neg  . Kidney stone   . Vitamin D deficiency     Patient Active Problem List   Diagnosis Date Noted  . Gastroenteritis 01/06/2017  . Preterm uterine contractions 12/27/2016  . Pap smear abnormality of cervix with ASCUS favoring benign 08/17/2016  . Amenorrhea 07/10/2016  . History of cesarean section 07/10/2016    Past Surgical History:  Procedure Laterality Date  . CESAREAN SECTION  2014  . kidney stone remove  2016    Prior to Admission medications   Medication Sig Start Date End Date Taking? Authorizing Provider  Multiple Vitamin (MULTI-VITAMIN DAILY) TABS Take by mouth.    [provider]  prenatal vitamin w/FE, FA (PRENATAL 1 + 1) 27-1 MG TABS tablet Take 1 tablet by mouth daily at 12 noon.    [provider]  Vitamin D, Ergocalciferol, (DRISDOL) 1.25 MG (50000 UT) CAPS capsule Take 50,000 Units by mouth once a week. 08/19/19   [provider]    Allergies Sulfa antibiotics  Family History  Problem Relation Age of Onset  . Hypertension Mother   . Stroke Father   .  Cancer Maternal Grandfather   . Diabetes Paternal Grandmother   . Breast cancer Paternal Grandmother   . Cancer Paternal Grandfather     Social History Social History   Tobacco Use  . Smoking status: Never Smoker  . Smokeless tobacco: Never Used  Substance Use Topics  . Alcohol use: Not Currently  . Drug use: No    Review of Systems Constitutional: Negative for fever. Respiratory: Negative for shortness of breath or cough. Gastrointestinal: Positive for abdominal pain; negative for nausea , negative for vomiting. Genitourinary: Negative for dysuria , negative for vaginal discharge. Musculoskeletal: Negative for back pain. Skin: Negative for acute skin changes/rash/lesion. ____________________________________________   PHYSICAL EXAM:  VITAL SIGNS: ED Triage Vitals  Enc Vitals Group     BP 05/04/20 1629 140/65     Pulse Rate 05/04/20 1629 68     Resp 05/04/20 1629 18     Temp 05/04/20 1629 98.5 F (36.9 C)     Temp Source 05/04/20 1629 Oral     SpO2 05/04/20 1629 99 %     Weight 05/04/20 1635 170 lb (77.1 kg)     Height 05/04/20 1635 5\' 9"  (1.753 m)     Head Circumference --      Peak Flow --      Pain Score 05/04/20 1634 7     Pain Loc --      Pain Edu? --      Excl. in GC? --  Constitutional: Alert and oriented. Well appearing and in no acute distress. Eyes: Conjunctivae are normal. Head: Atraumatic. Nose: No congestion/rhinnorhea. Mouth/Throat: Mucous membranes are moist. Respiratory: Normal respiratory effort.  No retractions. Gastrointestinal: Bowel sounds active x 4; Abdomen is soft without rebound or guarding. Genitourinary: Pelvic exam: Not indicated Musculoskeletal: No extremity tenderness nor edema.  Neurologic:  Normal speech and language. No gross focal neurologic deficits are appreciated. Speech is normal. No gait instability. Skin:  Skin is warm, dry and intact. No rash noted on exposed skin. Psychiatric: Mood and affect are normal. Speech  and behavior are normal.  ____________________________________________   LABS (all labs ordered are listed, but only abnormal results are displayed)  Labs Reviewed  HCG, QUANTITATIVE, PREGNANCY - Abnormal; Notable for the following components:      Result Value   hCG, Beta Chain, Quant, S 644 (*)    All other components within normal limits  BASIC METABOLIC PANEL - Abnormal; Notable for the following components:   Glucose, Bld 108 (*)    All other components within normal limits  CBC WITH DIFFERENTIAL/PLATELET  ABO/RH   ____________________________________________  RADIOLOGY  Ultrasound does not confirm IUP. No ectopic pregnancy visualized. ____________________________________________  Procedures  ____________________________________________  39 year old female presenting to the emergency department for evaluation of pelvic pain.  She is approximately 5 and half weeks pregnant by LMP.  Pain is intermittent and located in the left pelvic area.  Ultrasound does not show the location of the pregnancy.  Ectopic pregnancy cannot be ruled out.  The patient was advised that she needs to call her gynecologist for a follow-up appointment within the next 2 weeks.  She is already an established patient with Dr. Marcelline Mates.  She was given strict ER return precautions to return for any increase in pain, sudden onset of nausea and vomiting, or onset of bright red vaginal bleeding.  Patient verbalized understanding and will call her gynecologist tomorrow.  She was advised to take Tylenol and rest in the meantime.  ____________________________________________   FINAL CLINICAL IMPRESSION(S) / ED DIAGNOSES  Final diagnoses:  Abdominal pain during pregnancy in first trimester    Note:  This document was prepared using Dragon voice recognition software and may include unintentional dictation errors.   Victorino Dike, FNP 05/04/20 2152    Arta Silence, MD 05/04/20 2242

## 2020-05-06 ENCOUNTER — Telehealth: Payer: Self-pay

## 2020-05-06 DIAGNOSIS — O200 Threatened abortion: Secondary | ICD-10-CM

## 2020-05-06 NOTE — Telephone Encounter (Signed)
Spoke with pt. She maybe having a miscarriage due to the amount of bleed she has noticed. Pt stated that she was informed at the ED visit on 05/04/2020 that there was no visual sake. Pt wants to know if she needed to follow up with provider before her already scheduled on  appointment on 05/25/2020.

## 2020-05-06 NOTE — Telephone Encounter (Signed)
Pt thinks she is having a miscarriage. Pt started bleeding at 10:00am. Like a normal period.  + cramps- normal for her.  U/s at [facility]- 05/04/2020- did not see anything. Beta- 644  FH advises pt to go to Ed. PT requesting a call back.

## 2020-05-06 NOTE — Telephone Encounter (Signed)
Pt was called to speak to her more concerning her call the office. Pt did not answer the phone and her VM was full and unable to LM.

## 2020-05-06 NOTE — Telephone Encounter (Signed)
Please advise. Thanks Doryan Bahl 

## 2020-05-08 NOTE — Telephone Encounter (Signed)
She needs to follow up tomorrow for a repeat BHCG, her hormone levels are just to low to see anything on ultrasound as she is early in her pregnancy. We need to check to see if her hormone levels are trending up or down.

## 2020-05-09 NOTE — Addendum Note (Signed)
Addended by: Silvano Bilis on: 05/09/2020 12:33 PM   Modules accepted: Orders

## 2020-05-09 NOTE — Telephone Encounter (Signed)
Pt called no answer unable to LM due to VM is full. Sent pt a [employer].

## 2020-05-09 NOTE — Telephone Encounter (Signed)
Pt called no answer unable to LM due to VM is full. Sent pt [employer]. Was calling pt to see if she could come in today to do a blood drawn for a BETA.

## 2020-05-10 ENCOUNTER — Other Ambulatory Visit: Payer: Self-pay

## 2020-05-10 ENCOUNTER — Other Ambulatory Visit: Payer: No Typology Code available for payment source

## 2020-05-10 DIAGNOSIS — O200 Threatened abortion: Secondary | ICD-10-CM

## 2020-05-10 NOTE — Telephone Encounter (Signed)
Pt received the mychart messages and came into the office today for her beta drawn.

## 2020-05-11 LAB — BETA HCG QUANT (REF LAB): hCG Quant: 229 m[IU]/mL

## 2020-05-17 ENCOUNTER — Other Ambulatory Visit: Payer: Self-pay

## 2020-05-17 ENCOUNTER — Ambulatory Visit (LOCAL_COMMUNITY_HEALTH_CENTER): Payer: Self-pay

## 2020-05-17 DIAGNOSIS — Z111 Encounter for screening for respiratory tuberculosis: Secondary | ICD-10-CM

## 2020-05-20 ENCOUNTER — Other Ambulatory Visit: Payer: Self-pay

## 2020-05-20 ENCOUNTER — Ambulatory Visit (LOCAL_COMMUNITY_HEALTH_CENTER): Payer: No Typology Code available for payment source

## 2020-05-20 DIAGNOSIS — Z111 Encounter for screening for respiratory tuberculosis: Secondary | ICD-10-CM

## 2020-05-20 LAB — TB SKIN TEST
Induration: 0 mm
TB Skin Test: NEGATIVE

## 2020-05-25 ENCOUNTER — Other Ambulatory Visit: Payer: No Typology Code available for payment source

## 2020-05-25 ENCOUNTER — Encounter: Payer: No Typology Code available for payment source | Admitting: Obstetrics and Gynecology

## 2020-06-03 ENCOUNTER — Telehealth: Payer: Self-pay | Admitting: Nurse Practitioner

## 2020-06-03 NOTE — Telephone Encounter (Signed)
Please call pt back about question about immunization record form

## 2020-06-03 NOTE — Telephone Encounter (Signed)
LMTCB

## 2020-06-06 NOTE — Telephone Encounter (Signed)
Spoke with patient and she was able to get her immunization records from [place] and will be bringing them to the office along with form for Selena Batten to fill out for school.

## 2020-06-06 NOTE — Telephone Encounter (Signed)
LMTCB

## 2020-06-08 NOTE — Telephone Encounter (Signed)
Pt dropped off immunization records and form to be filled out. Please call when complete. Placed in colored folder up front.

## 2020-06-08 NOTE — Telephone Encounter (Signed)
I entered Immunizations into systems that she was able to get from [place]. Form filled out based on records and placed in folder to be signed.

## 2020-06-14 ENCOUNTER — Telehealth: Payer: Self-pay | Admitting: Nurse Practitioner

## 2020-06-14 NOTE — Telephone Encounter (Signed)
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 She needs to submit by next Monday.

## 2020-06-14 NOTE — Telephone Encounter (Signed)
Pt called in about her form she dropped off for her shot records.

## 2020-06-15 NOTE — Telephone Encounter (Signed)
Called patient last week to pick up forms and also again today to let her know the forms are ready and upfront for pickup

## 2020-07-07 IMAGING — US US OB < 14 WEEKS - US OB TV
1 series · 14 of 28 positions shown · non-contrast
Comparison: None.

CLINICAL DATA: Left lower quadrant abdominal pain. Positive
pregnancy test.

EXAM:
OBSTETRIC <14 WK US AND TRANSVAGINAL OB US
TECHNIQUE: Both transabdominal and transvaginal ultrasound examinations were
performed for complete evaluation of the gestation as well as the
maternal uterus, adnexal regions, and pelvic cul-de-sac.
Transvaginal technique was performed to assess early pregnancy.

[Series 1: us ob less than 14 weeks with ob transvaginal · 14 of 112 slices shown]
[im 5/112]
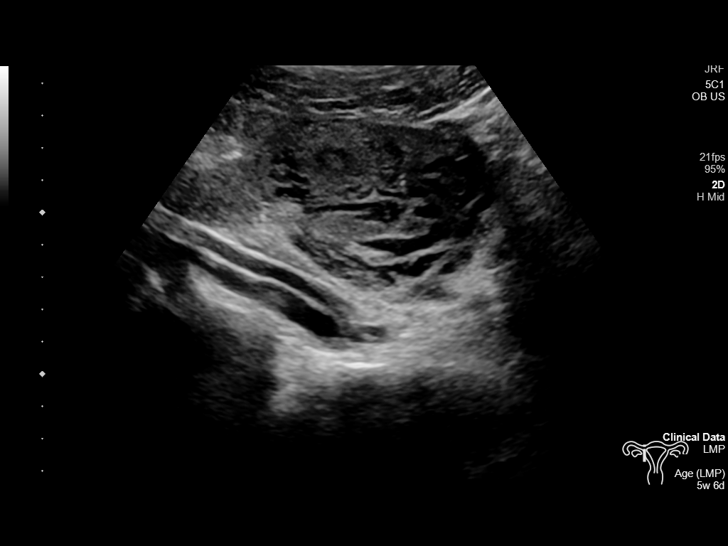
[im 13/112]
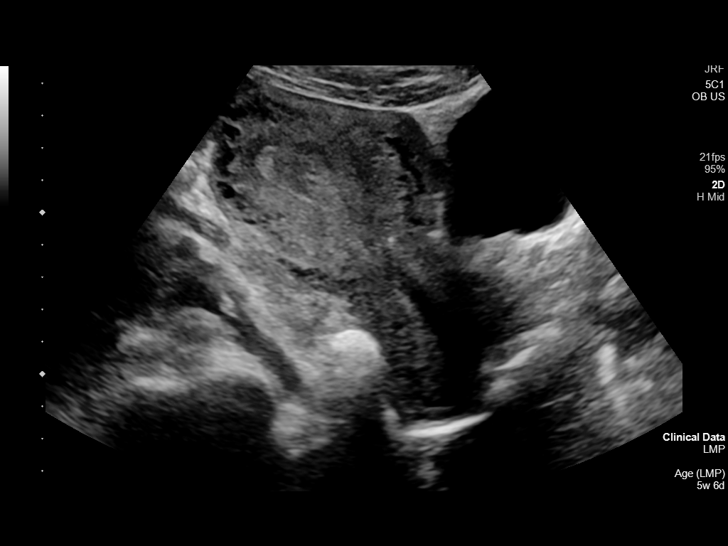
[im 21/112]
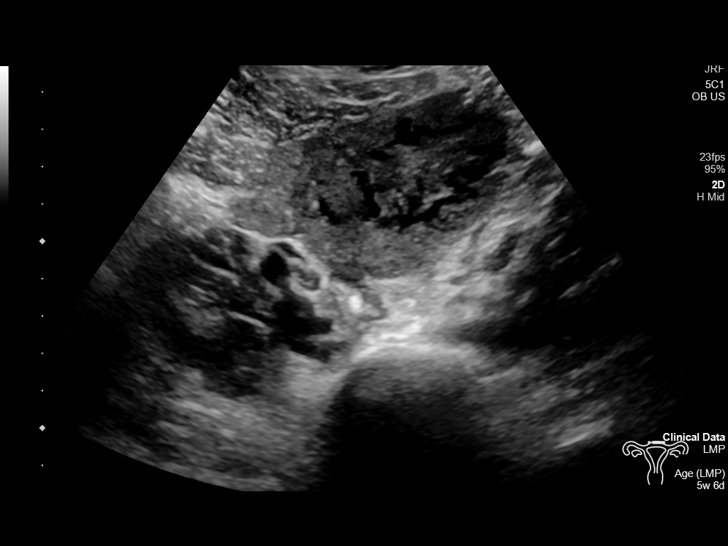
[im 29/112]
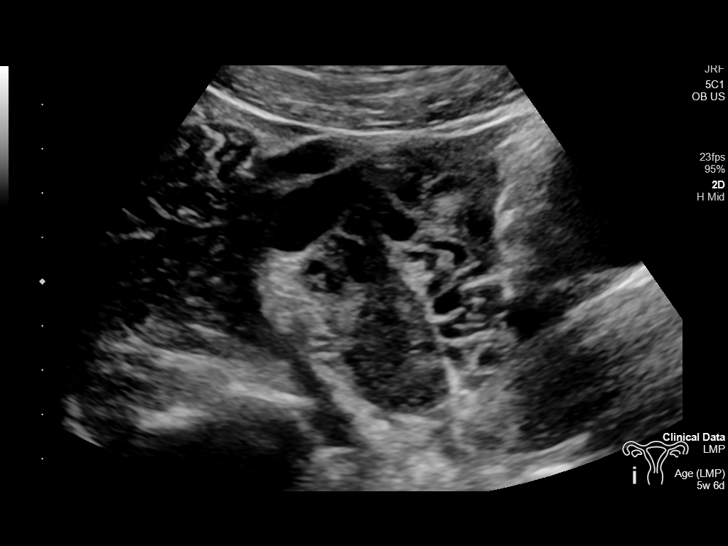
[im 38/112]
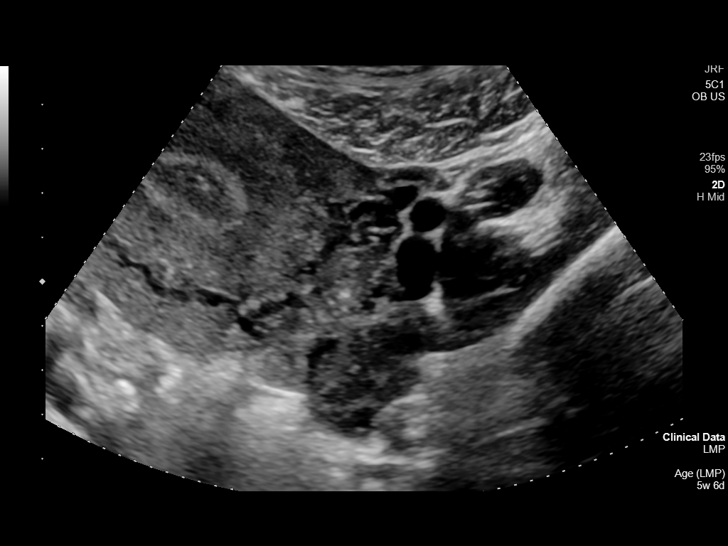
[im 46/112]
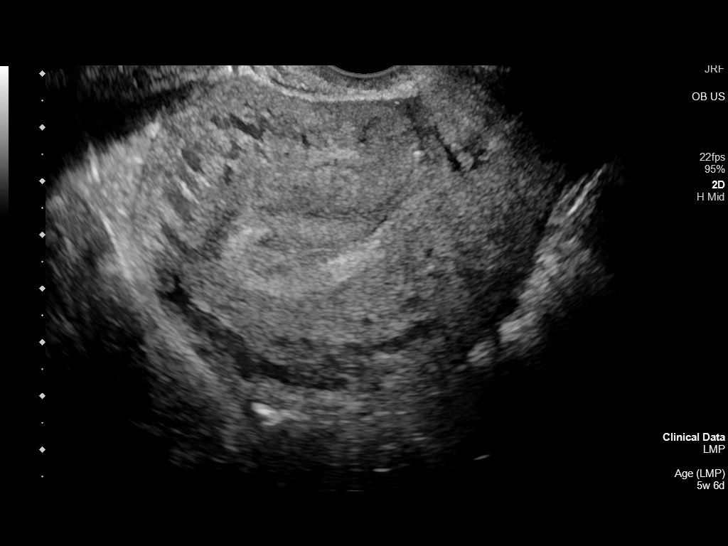
[im 54/112]
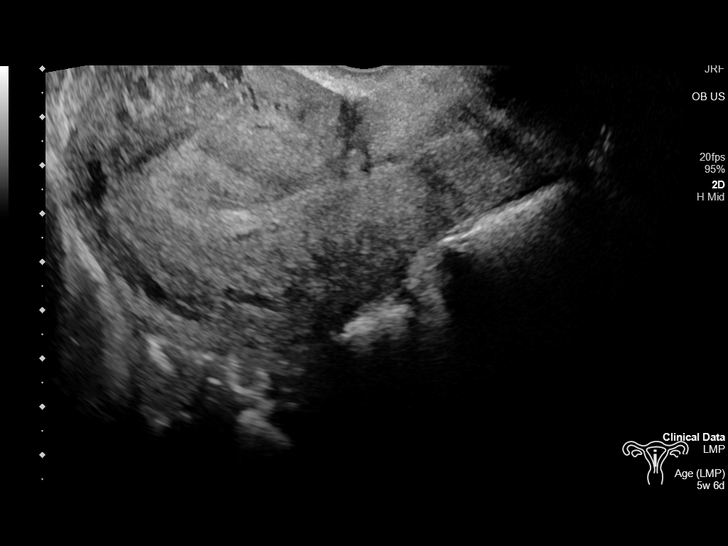
[im 62/112]
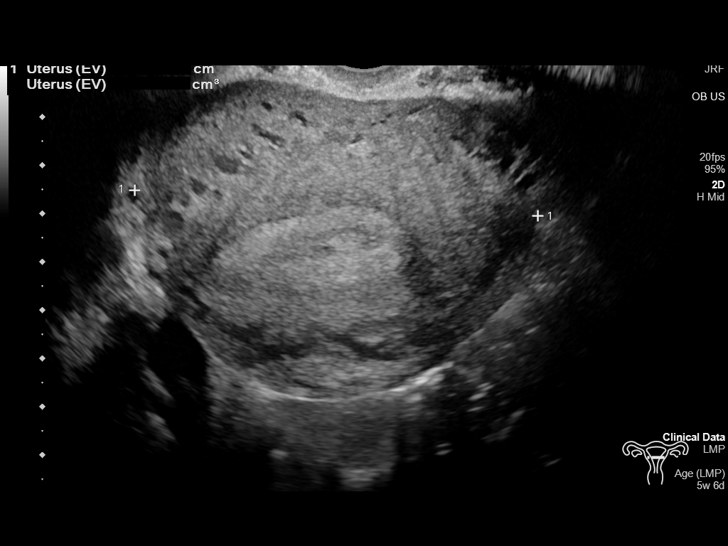
[im 70/112]
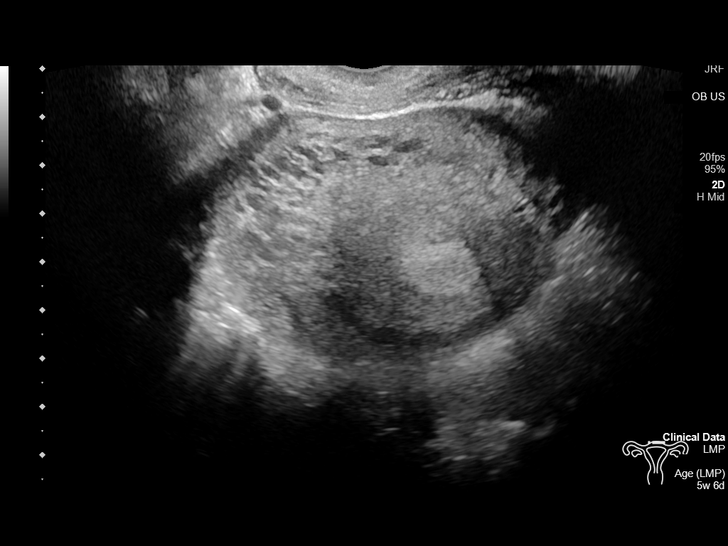
[im 79/112]
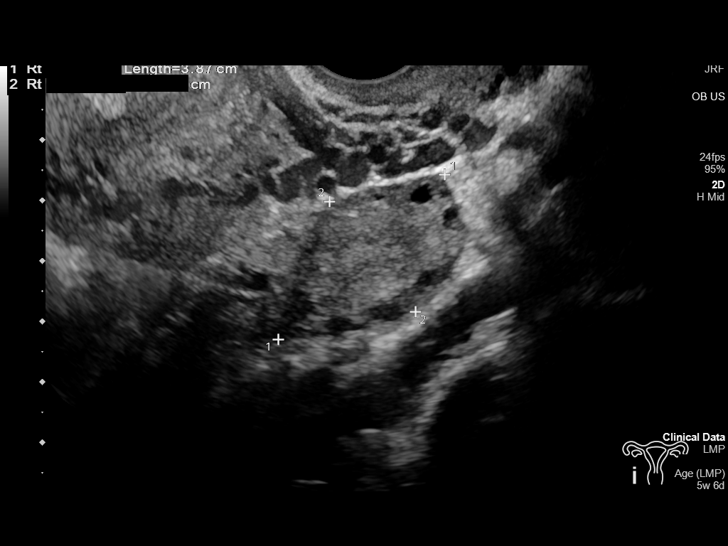
[im 87/112]
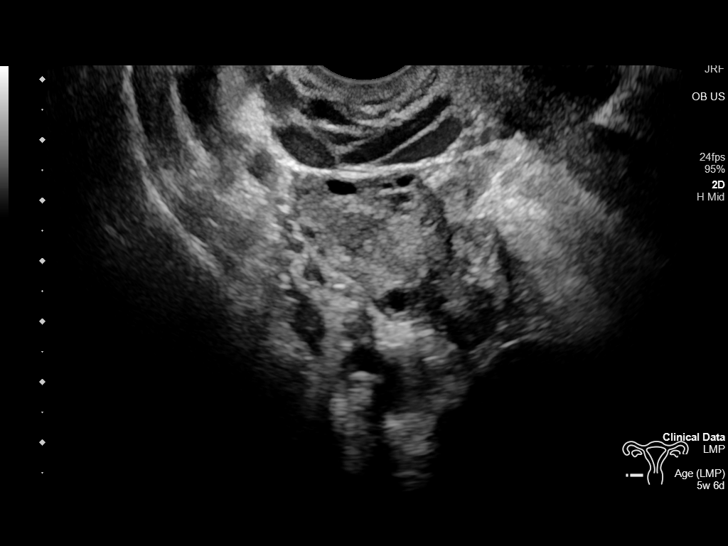
[im 95/112]
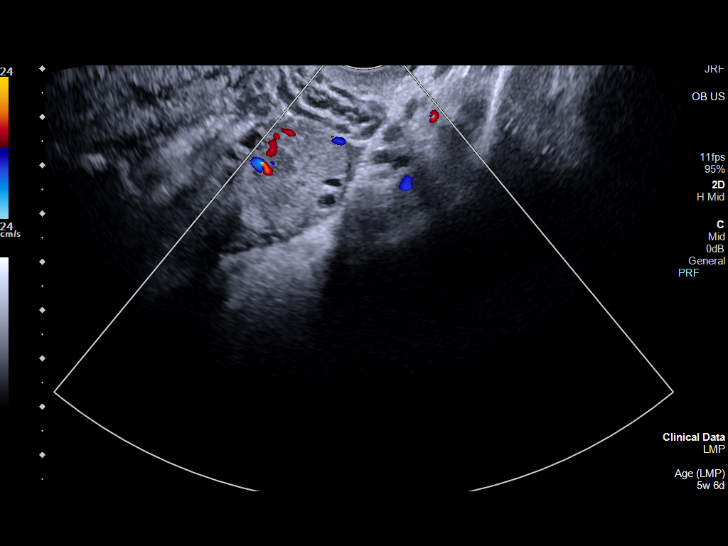
[im 103/112]
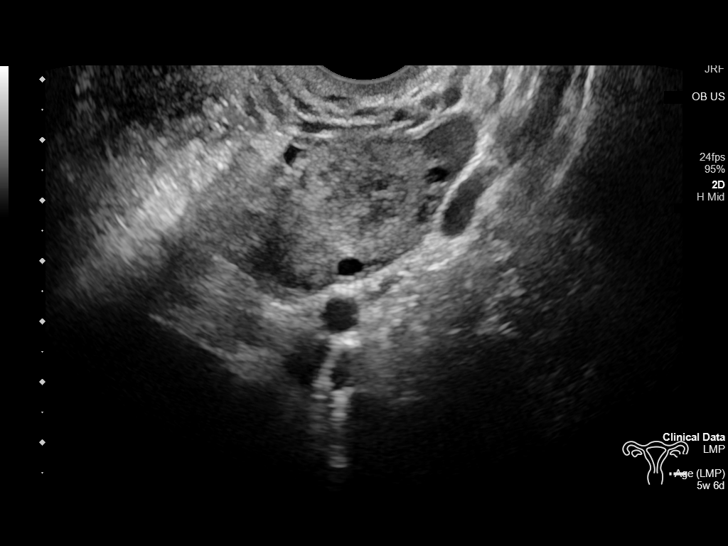
[im 112/112]
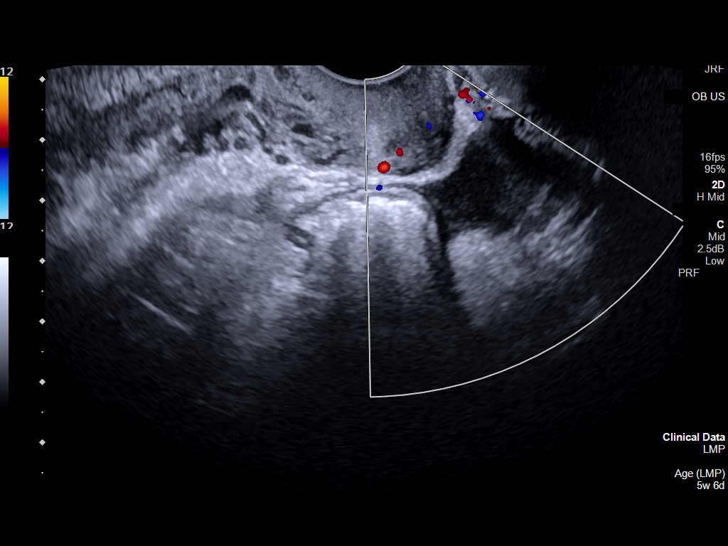

[14 of 28 positions shown; findings below may reference images not displayed]

FINDINGS: Intrauterine gestational sac: None

Yolk sac:  Not Visualized.

Embryo:  Not Visualized.

Cardiac Activity: Not Visualized.

Maternal uterus/adnexae: The ovaries are unremarkable. There may be
a corpus luteal cyst involving the left ovary measuring
approximately 1.8 cm. There is a trace amount of pelvic free fluid.
IMPRESSION: No IUP is visualized.

By definition, in the setting of a positive pregnancy test, this
reflects a pregnancy of unknown location. Differential
considerations include early normal IUP, abnormal IUP/missed
abortion, or nonvisualized ectopic pregnancy.

Serial beta HCG is suggested. Consider repeat pelvic ultrasound in
14 days.

## 2020-10-27 ENCOUNTER — Other Ambulatory Visit: Payer: Self-pay | Admitting: Nurse Practitioner

## 2020-10-27 ENCOUNTER — Other Ambulatory Visit: Payer: Self-pay

## 2020-10-27 ENCOUNTER — Encounter: Payer: Self-pay | Admitting: Nurse Practitioner

## 2020-10-27 ENCOUNTER — Ambulatory Visit (INDEPENDENT_AMBULATORY_CARE_PROVIDER_SITE_OTHER): Payer: No Typology Code available for payment source | Admitting: Nurse Practitioner

## 2020-10-27 VITALS — BP 110/62 | HR 76 | Temp 98.3°F | Ht 69.0 in | Wt 177.0 lb

## 2020-10-27 DIAGNOSIS — Z01818 Encounter for other preprocedural examination: Secondary | ICD-10-CM

## 2020-10-27 LAB — CBC WITH DIFFERENTIAL/PLATELET
Basophils Absolute: 0 10*3/uL (ref 0.0–0.1)
Basophils Relative: 0.6 % (ref 0.0–3.0)
Eosinophils Absolute: 0 10*3/uL (ref 0.0–0.7)
Eosinophils Relative: 0.9 % (ref 0.0–5.0)
HCT: 44 % (ref 36.0–46.0)
Hemoglobin: 14.9 g/dL (ref 12.0–15.0)
Lymphocytes Relative: 37.6 % (ref 12.0–46.0)
Lymphs Abs: 1.9 10*3/uL (ref 0.7–4.0)
MCHC: 33.8 g/dL (ref 30.0–36.0)
MCV: 86.1 fl (ref 78.0–100.0)
Monocytes Absolute: 0.3 10*3/uL (ref 0.1–1.0)
Monocytes Relative: 6.4 % (ref 3.0–12.0)
Neutro Abs: 2.7 10*3/uL (ref 1.4–7.7)
Neutrophils Relative %: 54.5 % (ref 43.0–77.0)
Platelets: 305 10*3/uL (ref 150.0–400.0)
RBC: 5.11 Mil/uL (ref 3.87–5.11)
RDW: 12.7 % (ref 11.5–15.5)
WBC: 5 10*3/uL (ref 4.0–10.5)

## 2020-10-27 LAB — COMPREHENSIVE METABOLIC PANEL
ALT: 20 U/L (ref 0–35)
AST: 23 U/L (ref 0–37)
Albumin: 4.7 g/dL (ref 3.5–5.2)
Alkaline Phosphatase: 40 U/L (ref 39–117)
BUN: 18 mg/dL (ref 6–23)
CO2: 28 mEq/L (ref 19–32)
Calcium: 10.3 mg/dL (ref 8.4–10.5)
Chloride: 102 mEq/L (ref 96–112)
Creatinine, Ser: 1.16 mg/dL (ref 0.40–1.20)
GFR: 60.41 mL/min (ref >60.00)
Glucose, Bld: 74 mg/dL (ref 70–99)
Potassium: 4.5 mEq/L (ref 3.5–5.1)
Sodium: 138 mEq/L (ref 135–145)
Total Bilirubin: 0.6 mg/dL (ref 0.2–1.2)
Total Protein: 7.2 g/dL (ref 6.0–8.3)

## 2020-10-27 LAB — HCG, QUANTITATIVE, PREGNANCY: Quantitative HCG: 3.53 m[IU]/mL

## 2020-10-27 NOTE — Progress Notes (Signed)
Established Patient Office Visit  Subjective:  Patient ID: Roberta Wagner, female    DOB: 11/25/1981  Age: 39 y.o. MRN: [id number]  CC:  Chief Complaint  Patient presents with  . Acute Visit    sx clearance    HPI Roberta Wagner presents for a pre surgery exam to complete from. She plans to have breast augmentation surgery in [place] on 12/15/20 and brings in a form to complete. Her PMH, PSH, SH, FH, medications, allergies reviewed and updated. She has no complaints today.   Past Medical History:  Diagnosis Date  . Abnormal Pap smear of vagina 2012   colpo neg  . Kidney stone   . Vitamin D deficiency     Past Surgical History:  Procedure Laterality Date  . CESAREAN SECTION  2014  . kidney stone remove  2016    Family History  Problem Relation Age of Onset  . Hypertension Mother   . Stroke Father   . Cancer Maternal Grandfather   . Diabetes Paternal Grandmother   . Breast cancer Paternal Grandmother   . Cancer Paternal Grandfather     Social History   Socioeconomic History  . Marital status: Married    Spouse name: Not on file  . Number of children: Not on file  . Years of education: Not on file  . Highest education level: Not on file  Occupational History  . Not on file  Tobacco Use  . Smoking status: Never Smoker  . Smokeless tobacco: Never Used  Vaping Use  . Vaping Use: Never used  Substance and Sexual Activity  . Alcohol use: Not Currently  . Drug use: No  . Sexual activity: Yes    Birth control/protection: None  Other Topics Concern  . Not on file  Social History Narrative  . Not on file   Social Determinants of Health   Financial Resource Strain:   . Difficulty of Paying Living Expenses: Not on file  Food Insecurity:   . Worried About Charity fundraiser in the Last Year: Not on file  . Ran Out of Food in the Last Year: Not on file  Transportation Needs:   . Lack of Transportation (Medical): Not on file  . Lack of  Transportation (Non-Medical): Not on file  Physical Activity:   . Days of Exercise per Week: Not on file  . Minutes of Exercise per Session: Not on file  Stress:   . Feeling of Stress : Not on file  Social Connections:   . Frequency of Communication with Friends and Family: Not on file  . Frequency of Social Gatherings with Friends and Family: Not on file  . Attends Religious Services: Not on file  . Active Member of Clubs or Organizations: Not on file  . Attends Archivist Meetings: Not on file  . Marital Status: Not on file  Intimate Partner Violence:   . Fear of Current or Ex-Partner: Not on file  . Emotionally Abused: Not on file  . Physically Abused: Not on file  . Sexually Abused: Not on file    Outpatient Medications Prior to Visit  Medication Sig Dispense Refill  . Multiple Vitamin (MULTI-VITAMIN DAILY) TABS Take by mouth.    . prenatal vitamin w/FE, FA (PRENATAL 1 + 1) 27-1 MG TABS tablet Take 1 tablet by mouth daily at 12 noon.    . Vitamin D, Ergocalciferol, (DRISDOL) 1.25 MG (50000 UT) CAPS capsule Take 50,000 Units by mouth once a week.  No facility-administered medications prior to visit.    Allergies  Allergen Reactions  . Sulfa Antibiotics Hives    Review of Systems  Constitutional: Negative.   HENT: Negative.   Eyes: Negative.   Respiratory: Negative.   Gastrointestinal: Negative.   Endocrine: Negative.   Genitourinary: Negative.   Musculoskeletal: Negative.   Allergic/Immunologic: Negative.   Neurological: Negative.   Hematological: Negative.   Psychiatric/Behavioral: Negative.       Objective:    Physical Exam Constitutional:      Appearance: Normal appearance. She is normal weight.  HENT:     Head: Normocephalic and atraumatic.  Eyes:     Conjunctiva/sclera: Conjunctivae normal.     Pupils: Pupils are equal, round, and reactive to light.  Cardiovascular:     Rate and Rhythm: Normal rate and regular rhythm.     Pulses:  Normal pulses.     Heart sounds: Normal heart sounds.  Pulmonary:     Effort: Pulmonary effort is normal.     Breath sounds: Normal breath sounds.  Abdominal:     General: Abdomen is flat.     Palpations: Abdomen is soft.     Tenderness: There is no abdominal tenderness.  Musculoskeletal:        General: Normal range of motion.     Cervical back: Normal range of motion and neck supple.  Skin:    General: Skin is warm and dry.  Neurological:     General: No focal deficit present.     Mental Status: She is alert and oriented to person, place, and time.  Psychiatric:        Mood and Affect: Mood normal.        Behavior: Behavior normal.        Thought Content: Thought content normal.        Judgment: Judgment normal.     BP 110/62 (BP Location: Left Arm, Patient Position: Sitting, Cuff Size: Normal)   Pulse 76   Temp 98.3 F (36.8 C) (Oral)   Ht '5\' 9"'  (1.753 m)   Wt 177 lb (80.3 kg)   LMP 03/24/2020   SpO2 97%   BMI 26.14 kg/m  Wt Readings from Last 3 Encounters:  10/27/20 177 lb (80.3 kg)  05/04/20 170 lb (77.1 kg)  05/04/20 178 lb 8 oz (81 kg)   Pulse Readings from Last 3 Encounters:  10/27/20 76  05/04/20 68  05/04/20 69    BP Readings from Last 3 Encounters:  10/27/20 110/62  05/04/20 140/65  05/04/20 133/75    No results found for: CHOL, HDL, LDLCALC, LDLDIRECT, TRIG, CHOLHDL    Health Maintenance Due  Topic Date Due  . Hepatitis C Screening  Never done    There are no preventive care reminders to display for this patient.  Lab Results  Component Value Date   TSH 0.47 09/09/2018   Lab Results  Component Value Date   WBC 5.0 10/27/2020   HGB 14.9 10/27/2020   HCT 44.0 10/27/2020   MCV 86.1 10/27/2020   PLT 305.0 10/27/2020   Lab Results  Component Value Date   NA 138 10/27/2020   K 4.5 10/27/2020   CO2 28 10/27/2020   GLUCOSE 74 10/27/2020   BUN 18 10/27/2020   CREATININE 1.16 10/27/2020   BILITOT 0.6 10/27/2020   ALKPHOS 40 10/27/2020    AST 23 10/27/2020   ALT 20 10/27/2020   PROT 7.2 10/27/2020   ALBUMIN 4.7 10/27/2020   CALCIUM 10.3 10/27/2020  ANIONGAP 6 05/04/2020   GFR 60.41 10/27/2020   No results found for: CHOL No results found for: HDL No results found for: LDLCALC No results found for: TRIG No results found for: CHOLHDL No results found for: HGBA1C    Assessment & Plan:   Problem List Items Addressed This Visit      Other   Encounter for preoperative assessment - Primary   Relevant Orders   CBC with Differential/Platelet (Completed)   PT and PTT (Completed)   Comp Met (CMET) (Completed)   B-HCG Quant (Completed)     She is well appearing without any complaints. Physical exam is unremarkable.   To the lab today for pre op clearance labs.   Follow-up: Return if symptoms worsen or fail to improve.   This visit occurred during the SARS-CoV-2 public health emergency.  Safety protocols were in place, including screening questions prior to the visit, additional usage of staff PPE, and extensive cleaning of exam room while observing appropriate contact time as indicated for disinfecting solutions.    Denice Paradise, NP

## 2020-10-27 NOTE — Patient Instructions (Addendum)
To the lab today for pre op clearance labs.

## 2020-10-28 ENCOUNTER — Encounter: Payer: Self-pay | Admitting: Nurse Practitioner

## 2020-10-28 ENCOUNTER — Telehealth: Payer: Self-pay

## 2020-10-28 ENCOUNTER — Telehealth: Payer: Self-pay | Admitting: *Deleted

## 2020-10-28 DIAGNOSIS — Z01818 Encounter for other preprocedural examination: Secondary | ICD-10-CM

## 2020-10-28 LAB — HIV ANTIBODY (ROUTINE TESTING W REFLEX): HIV 1&2 Ab, 4th Generation: NONREACTIVE

## 2020-10-28 LAB — PT AND PTT
INR: 1 (ref 0.9–1.2)
Prothrombin Time: 10.3 s (ref 9.1–12.0)
aPTT: 27 s (ref 24–33)

## 2020-10-28 NOTE — Telephone Encounter (Signed)
Patient called wanting to know if she could pick up her forms yet for her surgery? Have you been able to look at these?

## 2020-10-28 NOTE — Telephone Encounter (Signed)
Pt was just added to the lab schedule for Monday by Shanda Bumps I. Will forward to her to correct.

## 2020-10-28 NOTE — Telephone Encounter (Signed)
Pt would like to know if the paperwork that she brought in this week is completed. She states that it is time sensitive. Please advise

## 2020-10-28 NOTE — Telephone Encounter (Signed)
Spoke with patient about forms and aware they will be ready next week

## 2020-10-28 NOTE — Telephone Encounter (Signed)
Not needed. Her HCG was neg. I am not aware of any labs that need repeated.

## 2020-10-28 NOTE — Telephone Encounter (Signed)
Please confirm if she is ok to pick these up beginning of next week.  If so, will call when ready to pick up.  Let me know if a problem.

## 2020-10-28 NOTE — Telephone Encounter (Signed)
Please place future orders for lab appt.  

## 2020-10-28 NOTE — Telephone Encounter (Signed)
Patient is okay to pick them up next week as long as they can be picked up Monday or Tuesday as they are going out of town Wednesday. Patient is coming back into the office Monday for a lab appt.

## 2020-10-29 ENCOUNTER — Encounter: Payer: Self-pay | Admitting: Nurse Practitioner

## 2020-10-29 DIAGNOSIS — Z01818 Encounter for other preprocedural examination: Secondary | ICD-10-CM | POA: Insufficient documentation

## 2020-10-29 NOTE — Telephone Encounter (Signed)
Dr. Lorin Picket reviewed the [facility] labs and agrees is likely negative for pregnancy. However, to be certain, she does recommend another [facility] to be drawn around  Nov 11 to give it time to increase to confirm no pregnancy since her value was out of range for the lab test that was done here.  PLAN:   No lab on 10/31/20- too soon.   Yes, needs lab around 11/11 and needs lab appt. Orders in for future.

## 2020-10-29 NOTE — Telephone Encounter (Signed)
This is the patient we discussed.  Can sign form - to return for lab.

## 2020-10-31 ENCOUNTER — Other Ambulatory Visit: Payer: No Typology Code available for payment source

## 2020-10-31 NOTE — Telephone Encounter (Signed)
She will come in on 11/10/20  for repeat HCG and then the form can be singed if negative.

## 2020-10-31 NOTE — Telephone Encounter (Signed)
Patient called back wanted to know what she needs to do and she needs her paperwork for clearance for surgery no later than Tuesday 11-01-20

## 2020-10-31 NOTE — Telephone Encounter (Signed)
Patient called to check on paperwork  

## 2020-10-31 NOTE — Telephone Encounter (Signed)
Patient also sent mychart message and I replied there as I tried to call patient three times and went to VM

## 2020-10-31 NOTE — Telephone Encounter (Signed)
Ok.  Thank you.  Just let me know.

## 2020-10-31 NOTE — Telephone Encounter (Signed)
Patient rescheduled labs for 11/10/20

## 2020-11-10 ENCOUNTER — Other Ambulatory Visit (INDEPENDENT_AMBULATORY_CARE_PROVIDER_SITE_OTHER): Payer: No Typology Code available for payment source

## 2020-11-10 ENCOUNTER — Other Ambulatory Visit: Payer: Self-pay

## 2020-11-10 DIAGNOSIS — Z01818 Encounter for other preprocedural examination: Secondary | ICD-10-CM

## 2020-11-10 LAB — HCG, QUANTITATIVE, PREGNANCY: Quantitative HCG: 0.7 m[IU]/mL

## 2021-01-10 ENCOUNTER — Encounter: Payer: No Typology Code available for payment source | Admitting: Obstetrics and Gynecology

## 2021-01-10 DIAGNOSIS — Z01419 Encounter for gynecological examination (general) (routine) without abnormal findings: Secondary | ICD-10-CM

## 2021-01-10 NOTE — Progress Notes (Deleted)
Pt present for annual exam.  

## 2021-01-10 NOTE — Patient Instructions (Incomplete)

## 2021-01-31 ENCOUNTER — Encounter: Payer: Self-pay | Admitting: Obstetrics and Gynecology

## 2021-03-09 DIAGNOSIS — Z03818 Encounter for observation for suspected exposure to other biological agents ruled out: Secondary | ICD-10-CM | POA: Diagnosis not present

## 2021-03-09 DIAGNOSIS — Z20822 Contact with and (suspected) exposure to covid-19: Secondary | ICD-10-CM | POA: Diagnosis not present

## 2021-05-04 ENCOUNTER — Encounter: Payer: No Typology Code available for payment source | Admitting: Nurse Practitioner

## 2021-12-31 NOTE — L&D Delivery Note (Signed)
Delivery Summary for [employer]  Labor Events:   Preterm labor: No data found  Rupture date: 07/19/2022  Rupture time: 11:00 AM  Rupture type: Spontaneous  Fluid Color: Clear  Induction: No data found  Augmentation: No data found  Complications: No data found  Cervical ripening: No data found No data found   No data found     Delivery:   Episiotomy: No data found  Lacerations: No data found  Repair suture: No data found  Repair # of packets: No data found  Blood loss (ml): 50   Information for the patient's newborn:  Kadelyn, Dimascio [[id number]]   Delivery 07/19/2022 10:44 PM by  Vaginal, Spontaneous Sex:  female Gestational Age: [redacted]w[redacted]d Delivery Clinician:   Living?:         APGARS  One minute Five minutes Ten minutes  Skin color:        Heart rate:        Grimace:        Muscle tone:        Breathing:        Totals: 8  9      Presentation/position:      Resuscitation:   Cord information:    Disposition of cord blood:     Blood gases sent?  Complications:   Placenta: Delivered:       appearance Newborn Measurements: Weight: 7 lb 0.5 oz (3190 g)  Height: 19.69"  Head circumference:    Chest circumference:    Other providers:    Additional  information: Forceps:   Vacuum:   Breech:   Observed anomalies       Delivery Note At 10:44 PM a viable and healthy female was delivered via Vaginal, Spontaneous (Presentation: Left Occiput Anterior)  APGAR: 8, 9; weight  3190 grams by midwife Donato Schultz due to imminent delivery. I arrived within 1 minute after delivery of infant for delivery of the placenta. Placenta status: Spontaneous, Intact.  Cord: 3 vessels with the following complications: None.  Cord pH: not obtained. Delayed cord clamping observed for 9 minutes. Cord blood collected.   Anesthesia: Epidural Episiotomy: None Lacerations: None Suture Repair:  None Est. Blood Loss (mL): 50   Mom to postpartum.  Baby to Couplet care / Skin to  Skin.  Hildred Laser, MD 07/19/2022, 11:32 PM

## 2022-01-02 ENCOUNTER — Ambulatory Visit (INDEPENDENT_AMBULATORY_CARE_PROVIDER_SITE_OTHER): Payer: Self-pay | Admitting: Obstetrics and Gynecology

## 2022-01-02 ENCOUNTER — Other Ambulatory Visit: Payer: Self-pay

## 2022-01-02 ENCOUNTER — Telehealth: Payer: Self-pay | Admitting: Obstetrics and Gynecology

## 2022-01-02 ENCOUNTER — Encounter: Payer: Self-pay | Admitting: Obstetrics and Gynecology

## 2022-01-02 VITALS — BP 118/60 | HR 83 | Ht 69.0 in | Wt 183.1 lb

## 2022-01-02 DIAGNOSIS — N926 Irregular menstruation, unspecified: Secondary | ICD-10-CM

## 2022-01-02 DIAGNOSIS — Z1379 Encounter for other screening for genetic and chromosomal anomalies: Secondary | ICD-10-CM

## 2022-01-02 DIAGNOSIS — N912 Amenorrhea, unspecified: Secondary | ICD-10-CM

## 2022-01-02 DIAGNOSIS — Z98891 History of uterine scar from previous surgery: Secondary | ICD-10-CM

## 2022-01-02 DIAGNOSIS — Z3201 Encounter for pregnancy test, result positive: Secondary | ICD-10-CM

## 2022-01-02 LAB — POCT URINE PREGNANCY: Preg Test, Ur: POSITIVE — AB

## 2022-01-02 NOTE — Progress Notes (Signed)
° °  GYNECOLOGY CLINIC PROGRESS NOTE Subjective:    Roberta Wagner is a 41 y.o. [phone] female who presents for evaluation of amenorrhea. She believes she could be pregnant. Pregnancy is desired. Sexual Activity: single partner, contraception: none. Current symptoms also include: breast tenderness, fatigue, morning sickness, nausea, and positive home pregnancy test. Last period was normal. Patient's last menstrual period was 10/19/2021.  The following portions of the patient's history were reviewed and updated as appropriate: allergies, current medications, past family history, past medical history, past social history, past surgical history, and problem list.  Review of Systems Pertinent items noted in HPI and remainder of comprehensive ROS otherwise negative.     Objective:    BP 118/60    Pulse 83    Ht 5\' 9"  (1.753 m)    Wt 183 lb 1.6 oz (83.1 kg)    LMP 10/19/2021    BMI 27.04 kg/m  General: alert, no distress, and no acute distress   Abdomen:  FHT present 169 bpm   Lab Review Urine HCG: positive    Assessment:    Absence of menstruation. Nausea and vomiting of pregnancy H/o C/S x 1 with successful VBAC  Plan:   - Pregnancy Test: Positive: EDC: 07/26/2022 with EA 10.5 weeks. Briefly discussed pre-natal care options. Pregnancy, Childbirth and the Newborn book given. Encouraged well-balanced diet, plenty of rest when needed, pre-natal vitamins daily and walking for exercise. Discussed self-help for nausea, avoiding OTC medications until consulting provider or pharmacist, other than Tylenol as needed, minimal caffeine (1-2 cups daily) and avoiding alcohol. She will schedule her initial OB visit in the next 2-3 weeks and ultrasound for dating/viability. Feel free to call with any questions.  - H/o C/S x 1 with successful VBAC, desires TOLAC again.  - NOB labs ordered.    07/28/2022, MD [facility]

## 2022-01-02 NOTE — Patient Instructions (Signed)

## 2022-01-02 NOTE — Telephone Encounter (Signed)
Pt requested her labs not be put in her mychart as she wants to gender to be picked up and not shown on mychart. Is this possible if not will you please let pt know.

## 2022-01-03 LAB — ABO AND RH: Rh Factor: POSITIVE

## 2022-01-03 LAB — CBC WITH DIFFERENTIAL/PLATELET
Basophils Absolute: 0 10*3/uL (ref 0.0–0.2)
Basos: 0 %
EOS (ABSOLUTE): 0 10*3/uL (ref 0.0–0.4)
Eos: 0 %
Hematocrit: 39.9 % (ref 34.0–46.6)
Hemoglobin: 13.4 g/dL (ref 11.1–15.9)
Immature Grans (Abs): 0 10*3/uL (ref 0.0–0.1)
Immature Granulocytes: 0 %
Lymphocytes Absolute: 1.5 10*3/uL (ref 0.7–3.1)
Lymphs: 23 %
MCH: 28.8 pg (ref 26.6–33.0)
MCHC: 33.6 g/dL (ref 31.5–35.7)
MCV: 86 fL (ref 79–97)
Monocytes Absolute: 0.5 10*3/uL (ref 0.1–0.9)
Monocytes: 7 %
Neutrophils Absolute: 4.7 10*3/uL (ref 1.4–7.0)
Neutrophils: 70 %
Platelets: 266 10*3/uL (ref 150–450)
RBC: 4.66 x10E6/uL (ref 3.77–5.28)
RDW: 12.6 % (ref 11.7–15.4)
WBC: 6.8 10*3/uL (ref 3.4–10.8)

## 2022-01-03 LAB — RPR: RPR Ser Ql: NONREACTIVE

## 2022-01-03 LAB — VARICELLA ZOSTER ANTIBODY, IGG: Varicella zoster IgG: 1787 index (ref >165)

## 2022-01-03 LAB — DRUG PROFILE, UR, 9 DRUGS (LABCORP)
Amphetamines, Urine: NEGATIVE ng/mL
Barbiturate Quant, Ur: NEGATIVE ng/mL
Benzodiazepine Quant, Ur: NEGATIVE ng/mL
Cannabinoid Quant, Ur: NEGATIVE ng/mL
Cocaine (Metab.): NEGATIVE ng/mL
Methadone Screen, Urine: NEGATIVE ng/mL
Opiate Quant, Ur: NEGATIVE ng/mL
PCP Quant, Ur: NEGATIVE ng/mL
Propoxyphene: NEGATIVE ng/mL

## 2022-01-03 LAB — HIV ANTIBODY (ROUTINE TESTING W REFLEX): HIV Screen 4th Generation wRfx: NONREACTIVE

## 2022-01-03 LAB — HEPATITIS C ANTIBODY: Hep C Virus Ab: <0.1 s/co ratio (ref 0.0–0.9)

## 2022-01-03 LAB — HEPATITIS B SURFACE ANTIBODY,QUALITATIVE: Hep B Surface Ab, Qual: REACTIVE

## 2022-01-03 LAB — RUBELLA ANTIBODY, IGM: Rubella IgM: <20 AU/mL (ref 0.0–19.9)

## 2022-01-04 LAB — GC/CHLAMYDIA PROBE AMP
Chlamydia trachomatis, NAA: NEGATIVE
Neisseria Gonorrhoeae by PCR: NEGATIVE

## 2022-01-05 LAB — URINE CULTURE, OB REFLEX

## 2022-01-05 LAB — CULTURE, OB URINE

## 2022-01-06 ENCOUNTER — Other Ambulatory Visit: Payer: Self-pay | Admitting: Obstetrics and Gynecology

## 2022-01-06 LAB — MATERNIT21  PLUS CORE+ESS+SCA, BLOOD

## 2022-01-06 MED ORDER — NITROFURANTOIN MONOHYD MACRO 100 MG PO CAPS: 100.0000 mg | ORAL_CAPSULE | Freq: Two times a day (BID) | ORAL | 0 refills | Status: DC

## 2022-01-16 ENCOUNTER — Other Ambulatory Visit: Payer: Self-pay

## 2022-01-16 ENCOUNTER — Ambulatory Visit (INDEPENDENT_AMBULATORY_CARE_PROVIDER_SITE_OTHER): Payer: Medicaid Other

## 2022-01-16 DIAGNOSIS — Z3201 Encounter for pregnancy test, result positive: Secondary | ICD-10-CM | POA: Diagnosis not present

## 2022-01-19 ENCOUNTER — Other Ambulatory Visit: Payer: Self-pay

## 2022-01-19 ENCOUNTER — Ambulatory Visit (INDEPENDENT_AMBULATORY_CARE_PROVIDER_SITE_OTHER): Payer: Medicaid Other | Admitting: Obstetrics and Gynecology

## 2022-01-19 ENCOUNTER — Encounter: Payer: Self-pay | Admitting: Obstetrics and Gynecology

## 2022-01-19 VITALS — BP 108/57 | HR 88 | Ht 69.0 in | Wt 182.1 lb

## 2022-01-19 DIAGNOSIS — O2341 Unspecified infection of urinary tract in pregnancy, first trimester: Secondary | ICD-10-CM

## 2022-01-19 DIAGNOSIS — Z3481 Encounter for supervision of other normal pregnancy, first trimester: Secondary | ICD-10-CM

## 2022-01-19 DIAGNOSIS — O09522 Supervision of elderly multigravida, second trimester: Secondary | ICD-10-CM

## 2022-01-19 DIAGNOSIS — Z3A13 13 weeks gestation of pregnancy: Secondary | ICD-10-CM

## 2022-01-19 LAB — POCT URINALYSIS DIPSTICK OB
Bilirubin, UA: NEGATIVE
Glucose, UA: NEGATIVE
Ketones, UA: NEGATIVE
Nitrite, UA: NEGATIVE
Spec Grav, UA: 1.025 (ref 1.010–1.025)
Urobilinogen, UA: 0.2 E.U./dL
pH, UA: 6.5 (ref 5.0–8.0)

## 2022-01-19 NOTE — Progress Notes (Signed)
NOB Physical: She is doing well today. She has already had her genetic screening done. No pap smear needed.

## 2022-01-19 NOTE — Progress Notes (Signed)
OBSTETRIC INITIAL PRENATAL VISIT  Subjective:    Roberta Wagner is being seen today for her first obstetrical visit.  This is a planned pregnancy. She is a 41 y.o. [id number] female at [redacted]w[redacted]d gestation, Estimated Date of Delivery: 07/26/22 with Patient's last menstrual period was 10/19/2021.,  consistent with 13 week sono. Her obstetrical history is significant for advanced maternal age, UTI in pregnancy. Relationship with FOB: spouse, living together. Patient does intend to breast feed. Pregnancy history fully reviewed.    OB History  Gravida Para Term Preterm AB Living  6 3 3  0 2 3  SAB IAB Ectopic Multiple Live Births  1 0 0 0 3    # Outcome Date GA Lbr Len/2nd Weight Sex Delivery Anes PTL Lv  6 Current           5 Term 03/05/17 [redacted]w[redacted]d / 00:12 7 lb 9.7 oz (3.45 kg) M VBAC EPI  LIV     Name: Wandler,BOY Lynee     Apgar1: 8  Apgar5: 9  4 SAB 2016          3 AB 2014          2 Term 2014 [redacted]w[redacted]d  7 lb 1.8 oz (3.225 kg) F CS-LTranv   LIV     Complications: Breech birth  1 Term 2012 [redacted]w[redacted]d  7 lb 9.6 oz (3.447 kg) F Vag-Spont   LIV    Gynecologic History:  Last pap smear was 01/05/2020.  Results were Normal. Reports remote h/o abnormal pap smears in the past.  Denies history of STIs.  Contraception prior to conception: None.    Past Medical History:  Diagnosis Date   Abnormal Pap smear of vagina 2012   colpo neg   Kidney stone    Vitamin D deficiency     Family History  Problem Relation Age of Onset   Hypertension Mother    Stroke Father    Cancer Maternal Grandfather    Diabetes Paternal Grandmother    Breast cancer Paternal Grandmother    Cancer Paternal Grandfather     Past Surgical History:  Procedure Laterality Date   CESAREAN SECTION  2014   kidney stone remove  2016    Social History   Socioeconomic History   Marital status: Married    Spouse name: Not on file   Number of children: Not on file   Years of education: Not on file   Highest education level:  Not on file  Occupational History   Not on file  Tobacco Use   Smoking status: Never   Smokeless tobacco: Never  Vaping Use   Vaping Use: Never used  Substance and Sexual Activity   Alcohol use: Not Currently   Drug use: No   Sexual activity: Yes    Birth control/protection: None  Other Topics Concern   Not on file  Social History Narrative   Not on file   Social Determinants of Health   Financial Resource Strain: Not on file  Food Insecurity: Not on file  Transportation Needs: Not on file  Physical Activity: Not on file  Stress: Not on file  Social Connections: Not on file  Intimate Partner Violence: Not on file    Current Outpatient Medications on File Prior to Visit  Medication Sig Dispense Refill   nitrofurantoin, macrocrystal-monohydrate, (MACROBID) 100 MG capsule Take 1 capsule (100 mg total) by mouth 2 (two) times daily. 14 capsule 0   Prenatal Vit-Fe Fumarate-FA (MULTIVITAMIN-PRENATAL) 27-0.8 MG TABS tablet Take 1  tablet by mouth daily at 12 noon.     No current facility-administered medications on file prior to visit.    Allergies  Allergen Reactions   Sulfa Antibiotics Hives     Review of Systems General: Not Present- Fever, Weight Loss and Weight Gain. Skin: Not Present- Rash. HEENT: Not Present- Blurred Vision, Headache and Bleeding Gums. Respiratory: Not Present- Difficulty Breathing. Breast: Not Present- Breast Mass. Cardiovascular: Not Present- Chest Pain, Elevated Blood Pressure, Fainting / Blacking Out and Shortness of Breath. Gastrointestinal: Not Present- Abdominal Pain, Constipation, Nausea and Vomiting. Female Genitourinary: Not Present- Frequency, Painful Urination, Pelvic Pain, Vaginal Bleeding, Vaginal Discharge, Contractions, regular, Fetal Movements Decreased, Urinary Complaints and Vaginal Fluid. Musculoskeletal: Not Present- Back Pain and Leg Cramps. Neurological: Not Present- Dizziness. Psychiatric: Not Present- Depression.      Objective:   Blood pressure (!) 108/57, pulse 88, weight 182 lb 1.6 oz (82.6 kg), last menstrual period 10/19/2021, unknown if currently breastfeeding.  Body mass index is 26.89 kg/m.  General Appearance:    Alert, cooperative, no distress, appears stated age, overweight  Head:    Normocephalic, without obvious abnormality, atraumatic  Eyes:    PERRL, conjunctiva/corneas clear, EOM's intact, both eyes  Ears:    Normal external ear canals, both ears  Nose:   Nares normal, septum midline, mucosa normal, no drainage or sinus tenderness  Throat:   Lips, mucosa, and tongue normal; teeth and gums normal  Neck:   Supple, symmetrical, trachea midline, no adenopathy; thyroid: no enlargement/tenderness/nodules; no carotid bruit or JVD  Back:     Symmetric, no curvature, ROM normal, no CVA tenderness  Lungs:     Clear to auscultation bilaterally, respirations unlabored  Chest Wall:    No tenderness or deformity   Heart:    Regular rate and rhythm, S1 and S2 normal, no murmur, rub or gallop  Breast Exam:    No tenderness, masses, or nipple abnormality  Abdomen:     Soft, non-tender, bowel sounds active all four quadrants, no masses, no organomegaly.  FHT 161  bpm.  Genitalia:    Pelvic:deferred     Extremities:   Extremities normal, atraumatic, no cyanosis or edema  Pulses:   2+ and symmetric all extremities  Skin:   Skin color, texture, turgor normal, no rashes or lesions  Lymph nodes:   Cervical, supraclavicular, and axillary nodes normal  Neurologic:   CNII-XII intact, normal strength, sensation and reflexes throughout     Assessment:   1. Supervision of elderly multigravida (>=76 years old at time of delivery), second trimester   2. Pregnancy with 13 completed weeks gestation   3. UTI in pregnancy, antepartum, first trimester     Plan:   Supervision of elderly multigravida in second trimester - Initial labs reviewed. - Prenatal vitamins encouraged. - Problem list reviewed and  updated. - New OB counseling:  The patient has been given an overview regarding routine prenatal care.  Recommendations regarding diet, weight gain, and exercise in pregnancy were given. - Prenatal testing, optional genetic testing, and ultrasound use in pregnancy were reviewed.  Traditional genetic screening vs cell-fee DNA genetic screening discussed, including risks and benefits. Testing results reviewed ([id number]).  - Benefits of Breast Feeding were discussed. The patient is encouraged to consider nursing her baby post partum. - The patient has Medicaid.  [facility] Medicaid Risk Screening Form completed today   2. UTI in pregnancy, first trimester - Patient with Klebsiella UTI on NOB labs. Recently completed antibiotics. Will perform TOC  next visit.   Follow up in 4 weeks.    Hildred Laserherry, Kwesi Sangha, MD [facility]

## 2022-02-16 ENCOUNTER — Other Ambulatory Visit: Payer: Self-pay

## 2022-02-16 ENCOUNTER — Encounter: Payer: Self-pay | Admitting: Obstetrics and Gynecology

## 2022-02-16 ENCOUNTER — Ambulatory Visit (INDEPENDENT_AMBULATORY_CARE_PROVIDER_SITE_OTHER): Payer: Medicaid Other | Admitting: Obstetrics and Gynecology

## 2022-02-16 VITALS — BP 100/54 | HR 83 | Wt 182.0 lb

## 2022-02-16 DIAGNOSIS — Z3A17 17 weeks gestation of pregnancy: Secondary | ICD-10-CM

## 2022-02-16 DIAGNOSIS — Z1379 Encounter for other screening for genetic and chromosomal anomalies: Secondary | ICD-10-CM

## 2022-02-16 DIAGNOSIS — O09522 Supervision of elderly multigravida, second trimester: Secondary | ICD-10-CM

## 2022-02-16 LAB — POCT URINALYSIS DIPSTICK OB
Bilirubin, UA: NEGATIVE
Blood, UA: NEGATIVE
Glucose, UA: NEGATIVE
Ketones, UA: NEGATIVE
Leukocytes, UA: NEGATIVE
Nitrite, UA: NEGATIVE
POC,PROTEIN,UA: NEGATIVE
Spec Grav, UA: 1.01 (ref 1.010–1.025)
Urobilinogen, UA: 0.2 E.U./dL
pH, UA: 7 (ref 5.0–8.0)

## 2022-02-16 NOTE — Progress Notes (Signed)
ROB: No complaints.  Feels movement.  Desires AFP today.  Confirmed her desire for repeat vaginal birth after previous cesarean.  Anatomy ultrasound next visit.

## 2022-02-16 NOTE — Progress Notes (Signed)
ROB. Patient states fetal movement with no pressure. Patient reports fatigue. AFP ordered. Patient states no further questions or concerns.

## 2022-02-21 LAB — AFP, SERUM, OPEN SPINA BIFIDA
AFP MoM: 1.43
AFP Value: 56.4 ng/mL
Gest. Age on Collection Date: 17.1 weeks
Maternal Age At EDD: 38.8 yr
OSBR Risk 1 IN: 6621
Test Results:: NEGATIVE
Weight: 182 [lb_av]

## 2022-03-14 ENCOUNTER — Other Ambulatory Visit: Payer: Self-pay

## 2022-03-14 ENCOUNTER — Ambulatory Visit (INDEPENDENT_AMBULATORY_CARE_PROVIDER_SITE_OTHER): Payer: Medicaid Other

## 2022-03-14 ENCOUNTER — Ambulatory Visit (INDEPENDENT_AMBULATORY_CARE_PROVIDER_SITE_OTHER): Payer: Medicaid Other | Admitting: Obstetrics and Gynecology

## 2022-03-14 VITALS — BP 114/40 | HR 72 | Wt 187.1 lb

## 2022-03-14 DIAGNOSIS — Z3482 Encounter for supervision of other normal pregnancy, second trimester: Secondary | ICD-10-CM

## 2022-03-14 DIAGNOSIS — Z3A20 20 weeks gestation of pregnancy: Secondary | ICD-10-CM

## 2022-03-14 DIAGNOSIS — Z3A17 17 weeks gestation of pregnancy: Secondary | ICD-10-CM

## 2022-03-14 DIAGNOSIS — Z98891 History of uterine scar from previous surgery: Secondary | ICD-10-CM

## 2022-03-14 DIAGNOSIS — Z308 Encounter for other contraceptive management: Secondary | ICD-10-CM

## 2022-03-14 DIAGNOSIS — O09522 Supervision of elderly multigravida, second trimester: Secondary | ICD-10-CM

## 2022-03-14 LAB — POCT URINALYSIS DIPSTICK OB
Bilirubin, UA: NEGATIVE
Blood, UA: NEGATIVE
Glucose, UA: NEGATIVE
Ketones, UA: NEGATIVE
Leukocytes, UA: NEGATIVE
Nitrite, UA: NEGATIVE
POC,PROTEIN,UA: NEGATIVE
Spec Grav, UA: 1.01 (ref 1.010–1.025)
Urobilinogen, UA: 0.2 E.U./dL
pH, UA: 6.5 (ref 5.0–8.0)

## 2022-03-14 NOTE — Progress Notes (Signed)
ROB: Doing well, no major complaints. S/p anatomy scan today, normal.  Discussed contraception, desires BTL.  Discussed all other available methods. Medicaid BTL forms signed today.  RTC in 4 weeks.  ?

## 2022-03-14 NOTE — Progress Notes (Signed)
ROB: She is doing well, no new concerns. 

## 2022-04-10 ENCOUNTER — Ambulatory Visit (INDEPENDENT_AMBULATORY_CARE_PROVIDER_SITE_OTHER): Payer: Medicaid Other | Admitting: Obstetrics and Gynecology

## 2022-04-10 ENCOUNTER — Encounter: Payer: Self-pay | Admitting: Obstetrics and Gynecology

## 2022-04-10 VITALS — BP 110/62 | HR 86 | Wt 195.4 lb

## 2022-04-10 DIAGNOSIS — O09522 Supervision of elderly multigravida, second trimester: Secondary | ICD-10-CM

## 2022-04-10 DIAGNOSIS — Z3A24 24 weeks gestation of pregnancy: Secondary | ICD-10-CM

## 2022-04-10 LAB — POCT URINALYSIS DIPSTICK OB
Bilirubin, UA: NEGATIVE
Blood, UA: NEGATIVE
Glucose, UA: NEGATIVE
Ketones, UA: NEGATIVE
Leukocytes, UA: NEGATIVE
Nitrite, UA: NEGATIVE
POC,PROTEIN,UA: NEGATIVE
Spec Grav, UA: 1.015 (ref 1.010–1.025)
Urobilinogen, UA: 0.2 E.U./dL
pH, UA: 6.5 (ref 5.0–8.0)

## 2022-04-10 NOTE — Progress Notes (Signed)
ROB. Patient states fetal movement with no pain or pressure. She states occasional nausea but nothing she is concerned with. Patient states no questions or concerns at this time.   ?

## 2022-04-10 NOTE — Progress Notes (Signed)
ROB: She has no complaints.  Reports daily fetal movement.  1 hour GCT next visit. 

## 2022-05-01 ENCOUNTER — Other Ambulatory Visit: Payer: Self-pay

## 2022-05-01 DIAGNOSIS — Z3A27 27 weeks gestation of pregnancy: Secondary | ICD-10-CM

## 2022-05-01 DIAGNOSIS — Z131 Encounter for screening for diabetes mellitus: Secondary | ICD-10-CM

## 2022-05-01 DIAGNOSIS — O09522 Supervision of elderly multigravida, second trimester: Secondary | ICD-10-CM

## 2022-05-01 DIAGNOSIS — Z113 Encounter for screening for infections with a predominantly sexual mode of transmission: Secondary | ICD-10-CM

## 2022-05-01 NOTE — Addendum Note (Signed)
Addended by: Chilton Greathouse on: 05/01/2022 04:27 PM ? ? Modules accepted: Orders ? ?

## 2022-05-02 ENCOUNTER — Other Ambulatory Visit: Payer: Medicaid Other

## 2022-05-02 ENCOUNTER — Ambulatory Visit (INDEPENDENT_AMBULATORY_CARE_PROVIDER_SITE_OTHER): Payer: Medicaid Other | Admitting: Obstetrics and Gynecology

## 2022-05-02 ENCOUNTER — Encounter: Payer: Self-pay | Admitting: Obstetrics and Gynecology

## 2022-05-02 VITALS — BP 117/67 | HR 81 | Wt 194.2 lb

## 2022-05-02 DIAGNOSIS — Z3A27 27 weeks gestation of pregnancy: Secondary | ICD-10-CM

## 2022-05-02 DIAGNOSIS — O09522 Supervision of elderly multigravida, second trimester: Secondary | ICD-10-CM | POA: Diagnosis not present

## 2022-05-02 DIAGNOSIS — Z113 Encounter for screening for infections with a predominantly sexual mode of transmission: Secondary | ICD-10-CM

## 2022-05-02 DIAGNOSIS — Z23 Encounter for immunization: Secondary | ICD-10-CM

## 2022-05-02 DIAGNOSIS — Z131 Encounter for screening for diabetes mellitus: Secondary | ICD-10-CM

## 2022-05-02 DIAGNOSIS — Z98891 History of uterine scar from previous surgery: Secondary | ICD-10-CM

## 2022-05-02 LAB — POCT URINALYSIS DIPSTICK OB
Bilirubin, UA: NEGATIVE
Blood, UA: NEGATIVE
Glucose, UA: NEGATIVE
Ketones, UA: NEGATIVE
Leukocytes, UA: NEGATIVE
Nitrite, UA: NEGATIVE
Spec Grav, UA: 1.015 (ref 1.010–1.025)
Urobilinogen, UA: 0.2 E.U./dL
pH, UA: 6.5 (ref 5.0–8.0)

## 2022-05-02 NOTE — Progress Notes (Signed)
ROB: Doing well, no issues.  Discussed pain management in labor, desires epidural. For 28 week labs today.  Plans to breastfeed. Desires  BTL vs vasectomy  for contraception (signed Medicaid forms). For Tdap today, signed blood consent. Discussed changes in practice flow due to merger, will have patients meet/greet with midwives for next 2 visits.  ? ? ?

## 2022-05-02 NOTE — Progress Notes (Signed)
ROB: She is doing well. Has no new concerns today. ?

## 2022-05-03 LAB — CBC
Hematocrit: 36.4 % (ref 34.0–46.6)
Hemoglobin: 12.4 g/dL (ref 11.1–15.9)
MCH: 29.2 pg (ref 26.6–33.0)
MCHC: 34.1 g/dL (ref 31.5–35.7)
MCV: 86 fL (ref 79–97)
Platelets: 252 10*3/uL (ref 150–450)
RBC: 4.24 x10E6/uL (ref 3.77–5.28)
RDW: 12.3 % (ref 11.7–15.4)
WBC: 8.1 10*3/uL (ref 3.4–10.8)

## 2022-05-03 LAB — RPR: RPR Ser Ql: NONREACTIVE

## 2022-05-03 LAB — GLUCOSE, 1 HOUR GESTATIONAL: Gestational Diabetes Screen: 76 mg/dL (ref 70–139)

## 2022-05-11 ENCOUNTER — Other Ambulatory Visit: Payer: Self-pay

## 2022-05-11 ENCOUNTER — Emergency Department
Admission: EM | Admit: 2022-05-11 | Discharge: 2022-05-11 | Disposition: A | Discharge status: Home / Self Care | Payer: Medicaid Other | Attending: Emergency Medicine | Admitting: Emergency Medicine

## 2022-05-11 DIAGNOSIS — O9A213 Injury, poisoning and certain other consequences of external causes complicating pregnancy, third trimester: Secondary | ICD-10-CM | POA: Diagnosis not present

## 2022-05-11 DIAGNOSIS — Z3A28 28 weeks gestation of pregnancy: Secondary | ICD-10-CM | POA: Diagnosis not present

## 2022-05-11 DIAGNOSIS — T7840XA Allergy, unspecified, initial encounter: Secondary | ICD-10-CM | POA: Diagnosis not present

## 2022-05-11 DIAGNOSIS — O26893 Other specified pregnancy related conditions, third trimester: Secondary | ICD-10-CM | POA: Diagnosis present

## 2022-05-11 DIAGNOSIS — Z3493 Encounter for supervision of normal pregnancy, unspecified, third trimester: Secondary | ICD-10-CM

## 2022-05-11 MED ORDER — PREDNISONE 20 MG PO TABS: 40.0000 mg | ORAL_TABLET | Freq: Every day | ORAL | 0 refills | Status: AC

## 2022-05-11 MED ORDER — DIPHENHYDRAMINE HCL 50 MG/ML IJ SOLN
50.0000 mg | Freq: Once | INTRAMUSCULAR | Status: AC
  Administered 2022-05-11: 50 mg via INTRAVENOUS
  Filled 2022-05-11: qty 1

## 2022-05-11 MED ORDER — DIPHENHYDRAMINE HCL 25 MG PO TABS: 25.0000 mg | ORAL_TABLET | Freq: Four times a day (QID) | ORAL | 0 refills | Status: DC

## 2022-05-11 MED ORDER — METHYLPREDNISOLONE SODIUM SUCC 125 MG IJ SOLR
125.0000 mg | INTRAMUSCULAR | Status: AC
  Administered 2022-05-11: 125 mg via INTRAVENOUS
  Filled 2022-05-11: qty 2

## 2022-05-11 MED ORDER — SODIUM CHLORIDE 0.9 % IV BOLUS
1000.0000 mL | Freq: Once | INTRAVENOUS | Status: AC
  Administered 2022-05-11: 1000 mL via INTRAVENOUS

## 2022-05-11 NOTE — ED Notes (Signed)
EDP called to bedside. Pt is [redacted] weeks pregnant and suddenly about 40 minutes ago began feeling pruritic all over including throat. Examined throat, does not appear swollen. Pt is swallowing and talking normally. Pt does not know of any insect bites, new meds, or exposures to allergens but states husband works in a factory. Is allergic to sulfa drugs. ?

## 2022-05-11 NOTE — ED Provider Notes (Signed)
? ?[facility] ?Provider Note ? ? ? Event Date/Time  ? First MD Initiated Contact with Patient 05/11/22 1828   ?  (approximate) ? ? ?History  ? ?Allergic Reaction ? ? ?HPI ? ?Roberta Wagner is a 41 y.o. female who is currently about [redacted] weeks pregnant with uncomplicated pregnancy who comes ED complaining of suspected allergic reaction.  About 5:15 PM today, she started having itching all over her body and feeling a scratchiness in her throat.  She vomited 1 time before coming to the ED.  Denies shortness of breath.  No pain.  She has a known allergy to sulfa antibiotics, but has not been on any medications recently other than prenatal vitamins.  Denies any new exposures. ?  ? ? ?Physical Exam  ? ?Triage Vital Signs: ?ED Triage Vitals  ?Enc Vitals Group  ?   BP 05/11/22 1820 (!) 151/80  ?   Pulse Rate 05/11/22 1820 97  ?   Resp 05/11/22 1820 (!) 2  ?   Temp 05/11/22 1820 98.2 ?F (36.8 ?C)  ?   Temp src --   ?   SpO2 05/11/22 1820 99 %  ?   Weight --   ?   Height --   ?   Head Circumference --   ?   Peak Flow --   ?   Pain Score 05/11/22 1819 0  ?   Pain Loc --   ?   Pain Edu? --   ?   Excl. in GC? --   ? ? ?Most recent vital signs: ?Vitals:  ? 05/11/22 1820 05/11/22 1830  ?BP: (!) 151/80   ?Pulse: 97 86  ?Resp: (!) 2 20  ?Temp: 98.2 ?F (36.8 ?C)   ?SpO2: 99% 99%  ? ? ? ?General: Awake, no distress.  ?CV:  Good peripheral perfusion.  Regular rate and rhythm ?Resp:  Normal effort.  Clear to auscultation bilaterally ?Abd:  Gravid.  Size consistent with dates. ?Other:  No edema.  Faint erythematous rash diffusely.  Moist oral mucosa.  No oropharyngeal swelling.  No tongue elevation or floor mouth edema. ? ? ?ED Results / Procedures / Treatments  ? ?Labs ?(all labs ordered are listed, but only abnormal results are displayed) ?Labs Reviewed - No data to display ? ? ?EKG ? ? ? ? ?RADIOLOGY ? ? ? ? ?PROCEDURES: ? ?Critical Care performed: No ? ?Procedures ? ? ?MEDICATIONS ORDERED IN ED: ?Medications   ?methylPREDNISolone sodium succinate (SOLU-MEDROL) 125 mg/2 mL injection 125 mg (125 mg Intravenous Given 05/11/22 1831)  ?diphenhydrAMINE (BENADRYL) injection 50 mg (50 mg Intravenous Given 05/11/22 1834)  ?sodium chloride 0.9 % bolus 1,000 mL (1,000 mLs Intravenous New Bag/Given 05/11/22 1833)  ? ? ? ?IMPRESSION / MDM / ASSESSMENT AND PLAN / ED COURSE  ?I reviewed the triage vital signs and the nursing notes. ?             ?               ? ?Patient presents with allergic reaction.  Not consistent with anaphylaxis.  Vital signs are normal, no respiratory involvement, no suspected allergen exposure identifiable.  I doubt pulmonary edema or pleural effusion or pneumothorax.  Doubt vasculitis/autoimmune disorder, skin or soft tissue infection, or liver failure. ? ?After receiving IV Solu-Medrol and Benadryl, patient reports feeling better.  Lungs still clear to auscultation bilaterally.  Stable for discharge on a short course of prednisone and Benadryl. ? ?  ? ? ?FINAL  CLINICAL IMPRESSION(S) / ED DIAGNOSES  ? ?Final diagnoses:  ?Allergic reaction, initial encounter  ?Third trimester pregnancy at less than 36 weeks  ? ? ? ?Rx / DC Orders  ? ?ED Discharge Orders   ? ?      Ordered  ?  diphenhydrAMINE (BENADRYL) 25 MG tablet  Every 6 hours       ? 05/11/22 2017  ?  predniSONE (DELTASONE) 20 MG tablet  Daily with breakfast       ? 05/11/22 2017  ? ?  ?  ? ?  ? ? ? ?Note:  This document was prepared using Dragon voice recognition software and may include unintentional dictation errors. ?  ?Sharman Cheek, MD ?05/11/22 2022 ? ?

## 2022-05-11 NOTE — ED Triage Notes (Signed)
Pt comes with c/o allergic reaction. Pt states this just started hour ago. Pt states itching and burning all over. Pt states last time something like this happened it was sulfur.  ? ?Pt is 29 weeks. ?

## 2022-05-14 ENCOUNTER — Ambulatory Visit (INDEPENDENT_AMBULATORY_CARE_PROVIDER_SITE_OTHER): Payer: Medicaid Other | Admitting: Obstetrics

## 2022-05-14 VITALS — BP 125/75 | HR 86 | Wt 198.6 lb

## 2022-05-14 DIAGNOSIS — Z3483 Encounter for supervision of other normal pregnancy, third trimester: Secondary | ICD-10-CM

## 2022-05-14 LAB — POCT URINALYSIS DIPSTICK OB
Bilirubin, UA: NEGATIVE
Blood, UA: NEGATIVE
Glucose, UA: NEGATIVE
Ketones, UA: NEGATIVE
Leukocytes, UA: NEGATIVE
Nitrite, UA: NEGATIVE
POC,PROTEIN,UA: NEGATIVE
Spec Grav, UA: 1.015 (ref 1.010–1.025)
Urobilinogen, UA: 0.2 E.U./dL
pH, UA: 7.5 (ref 5.0–8.0)

## 2022-05-14 NOTE — Progress Notes (Signed)
ROB at [redacted]w[redacted]d. Active baby. Denies ctx, LOF, vaginal bleeding. Reviewed lab results. Recent ED visit for allergic reaction. Did not take prescribed prednisone d/t side effects. Encouraged to keep Benadryl or Zyrtec at home. Discussed prior birth - took 2 hours to get epidural. Would like to avoid that scenario again. Baby breech today. Recommended [employer]. 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 Reviewed s/s of PTL. RTC in 2 weeks with CNM. ? ?Lloyd Huger, CNM ?

## 2022-05-29 ENCOUNTER — Ambulatory Visit (INDEPENDENT_AMBULATORY_CARE_PROVIDER_SITE_OTHER): Payer: Medicaid Other | Admitting: Certified Nurse Midwife

## 2022-05-29 ENCOUNTER — Encounter: Payer: Self-pay | Admitting: Certified Nurse Midwife

## 2022-05-29 VITALS — BP 117/67 | HR 84 | Wt 200.4 lb

## 2022-05-29 DIAGNOSIS — Z3A31 31 weeks gestation of pregnancy: Secondary | ICD-10-CM

## 2022-05-29 LAB — POCT URINALYSIS DIPSTICK
Bilirubin, UA: NEGATIVE
Blood, UA: NEGATIVE
Glucose, UA: NEGATIVE
Ketones, UA: NEGATIVE
Nitrite, UA: NEGATIVE
Protein, UA: NEGATIVE
Spec Grav, UA: 1.01 (ref 1.010–1.025)
Urobilinogen, UA: 0.2 E.U./dL
pH, UA: 6 (ref 5.0–8.0)

## 2022-05-29 NOTE — Patient Instructions (Signed)
[place] Pediatrician List  [facility]  [address], [place], [place] [address]  Phone: [phone]  [facility] (second location)  [address] [place], [place] [address]  Phone: [phone]  [facility] ([place]) [address], [place], [place] [address] Phone: [phone]  [facility]  [address] [place], [place] [address]  Phone: [phone] 

## 2022-05-29 NOTE — Progress Notes (Signed)
ROB doing well, discussed with pt potentially being on call when she come in for labor. 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 Pt feels good fetal movement. State she felt baby flip last night. Leopolds suggest vertex presentation. Discussed should baby flip a lot potentially using abdominal binder around 36-37 wks to keep baby head down. She will follow up 2 wk with MD.   Doreene Burke, CNM

## 2022-06-12 ENCOUNTER — Ambulatory Visit (INDEPENDENT_AMBULATORY_CARE_PROVIDER_SITE_OTHER): Payer: Medicaid Other | Admitting: Obstetrics and Gynecology

## 2022-06-12 ENCOUNTER — Encounter: Payer: Self-pay | Admitting: Obstetrics and Gynecology

## 2022-06-12 VITALS — BP 107/67 | HR 84 | Wt 199.8 lb

## 2022-06-12 DIAGNOSIS — Z3483 Encounter for supervision of other normal pregnancy, third trimester: Secondary | ICD-10-CM

## 2022-06-12 DIAGNOSIS — Z3A33 33 weeks gestation of pregnancy: Secondary | ICD-10-CM

## 2022-06-12 LAB — POCT URINALYSIS DIPSTICK OB
Bilirubin, UA: NEGATIVE
Blood, UA: NEGATIVE
Glucose, UA: NEGATIVE
Ketones, UA: NEGATIVE
Nitrite, UA: NEGATIVE
POC,PROTEIN,UA: NEGATIVE
Spec Grav, UA: 1.015 (ref 1.010–1.025)
Urobilinogen, UA: 0.2 E.U./dL
pH, UA: 7 (ref 5.0–8.0)

## 2022-06-12 NOTE — Progress Notes (Signed)
ROB: She reports no complaints.  Denies contractions.  Feels daily fetal movement.  Leopold's performed- position undetermined.  If there is still a doubt consider ultrasound after next visit.

## 2022-06-12 NOTE — Progress Notes (Signed)
ROB. Patient states fetal movement with normal pain or pressure, states occasional braxton hicks. Patient states no questions or concerns at this time.

## 2022-06-27 ENCOUNTER — Ambulatory Visit (INDEPENDENT_AMBULATORY_CARE_PROVIDER_SITE_OTHER): Payer: Medicaid Other | Admitting: Obstetrics and Gynecology

## 2022-06-27 ENCOUNTER — Encounter: Payer: Self-pay | Admitting: Obstetrics and Gynecology

## 2022-06-27 VITALS — BP 117/65 | HR 83 | Wt 202.4 lb

## 2022-06-27 DIAGNOSIS — Z113 Encounter for screening for infections with a predominantly sexual mode of transmission: Secondary | ICD-10-CM

## 2022-06-27 DIAGNOSIS — Z3685 Encounter for antenatal screening for Streptococcus B: Secondary | ICD-10-CM

## 2022-06-27 DIAGNOSIS — Z3A35 35 weeks gestation of pregnancy: Secondary | ICD-10-CM

## 2022-06-27 DIAGNOSIS — Z3483 Encounter for supervision of other normal pregnancy, third trimester: Secondary | ICD-10-CM

## 2022-06-27 DIAGNOSIS — O321XX0 Maternal care for breech presentation, not applicable or unspecified: Secondary | ICD-10-CM

## 2022-06-27 LAB — POCT URINALYSIS DIPSTICK OB
Bilirubin, UA: NEGATIVE
Glucose, UA: NEGATIVE
Ketones, UA: NEGATIVE
Nitrite, UA: NEGATIVE
Spec Grav, UA: 1.02 (ref 1.010–1.025)
Urobilinogen, UA: 0.2 E.U./dL
pH, UA: 6.5 (ref 5.0–8.0)

## 2022-06-27 NOTE — Progress Notes (Signed)
ROB: Patient doing well, no issues. Concerns for fetal positioning, feels breech on Leopolds. Discussed maneuvers [employer] website), moxibustion, chiropractor. Will f/u at next visit and to have [place] for presentation. Also disucssed ECV at 37 weeks if not turned. 36 week cultures performed today. RTC in 1 week.

## 2022-06-27 NOTE — Progress Notes (Signed)
ROB: She is doing well, no new concerns. 

## 2022-06-29 ENCOUNTER — Ambulatory Visit
Admission: RE | Admit: 2022-06-29 | Discharge: 2022-06-29 | Disposition: A | Discharge status: Home / Self Care | Payer: Medicaid Other | Source: Ambulatory Visit | Attending: Obstetrics and Gynecology | Admitting: Obstetrics and Gynecology

## 2022-06-29 DIAGNOSIS — O321XX0 Maternal care for breech presentation, not applicable or unspecified: Secondary | ICD-10-CM | POA: Insufficient documentation

## 2022-06-29 LAB — STREP GP B NAA: Strep Gp B NAA: POSITIVE — AB

## 2022-06-30 LAB — GC/CHLAMYDIA PROBE AMP
Chlamydia trachomatis, NAA: NEGATIVE
Neisseria Gonorrhoeae by PCR: NEGATIVE

## 2022-07-04 ENCOUNTER — Ambulatory Visit (INDEPENDENT_AMBULATORY_CARE_PROVIDER_SITE_OTHER): Payer: Medicaid Other | Admitting: Obstetrics

## 2022-07-04 VITALS — BP 127/41 | HR 78 | Wt 203.0 lb

## 2022-07-04 DIAGNOSIS — Z3A36 36 weeks gestation of pregnancy: Secondary | ICD-10-CM

## 2022-07-04 LAB — POCT URINALYSIS DIPSTICK OB
Bilirubin, UA: NEGATIVE
Blood, UA: NEGATIVE
Glucose, UA: NEGATIVE
Ketones, UA: NEGATIVE
Leukocytes, UA: NEGATIVE
Nitrite, UA: NEGATIVE
POC,PROTEIN,UA: NEGATIVE
Spec Grav, UA: 1.02 (ref 1.010–1.025)
Urobilinogen, UA: 0.2 E.U./dL
pH, UA: 6 (ref 5.0–8.0)

## 2022-07-04 NOTE — Progress Notes (Signed)
ROB at [redacted]w[redacted]d. Active baby. US showed cephalic baby! AFI 22.89. Roberta Wagner has been runs of contractions that are not relieved by rest, hydration, etc. that have been getting stronger over the last few days. She denies LOF and vaginal bleeding. She requests a cervical exam today: 1/75/-2, soft. Reviewed when to go to the hospital. Reviewed GBS results and antibiotics in labor. Everything is ready at home for baby. RTC in one week.  [name], CNM

## 2022-07-09 ENCOUNTER — Other Ambulatory Visit: Payer: Self-pay

## 2022-07-09 ENCOUNTER — Encounter: Payer: Self-pay | Admitting: Obstetrics and Gynecology

## 2022-07-09 ENCOUNTER — Ambulatory Visit (INDEPENDENT_AMBULATORY_CARE_PROVIDER_SITE_OTHER): Payer: Medicaid Other | Admitting: Obstetrics

## 2022-07-09 ENCOUNTER — Observation Stay
Admission: EM | Admit: 2022-07-09 | Discharge: 2022-07-09 | Disposition: A | Discharge status: Home / Self Care | Payer: Medicaid Other | Attending: Obstetrics and Gynecology | Admitting: Obstetrics and Gynecology

## 2022-07-09 ENCOUNTER — Encounter: Payer: Self-pay | Admitting: Obstetrics

## 2022-07-09 VITALS — BP 134/72 | HR 83 | Wt 202.0 lb

## 2022-07-09 DIAGNOSIS — O26893 Other specified pregnancy related conditions, third trimester: Secondary | ICD-10-CM

## 2022-07-09 DIAGNOSIS — Z3A37 37 weeks gestation of pregnancy: Secondary | ICD-10-CM

## 2022-07-09 DIAGNOSIS — O09523 Supervision of elderly multigravida, third trimester: Secondary | ICD-10-CM | POA: Insufficient documentation

## 2022-07-09 DIAGNOSIS — R109 Unspecified abdominal pain: Secondary | ICD-10-CM

## 2022-07-09 DIAGNOSIS — Z79899 Other long term (current) drug therapy: Secondary | ICD-10-CM | POA: Insufficient documentation

## 2022-07-09 DIAGNOSIS — O471 False labor at or after 37 completed weeks of gestation: Principal | ICD-10-CM | POA: Insufficient documentation

## 2022-07-09 DIAGNOSIS — O479 False labor, unspecified: Secondary | ICD-10-CM | POA: Diagnosis present

## 2022-07-09 LAB — URINALYSIS, COMPLETE (UACMP) WITH MICROSCOPIC
Bilirubin Urine: NEGATIVE
Glucose, UA: NEGATIVE mg/dL
Hgb urine dipstick: NEGATIVE
Ketones, ur: NEGATIVE mg/dL
Nitrite: NEGATIVE
Protein, ur: NEGATIVE mg/dL
Specific Gravity, Urine: 1.011 (ref 1.005–1.030)
pH: 7 (ref 5.0–8.0)

## 2022-07-09 MED ORDER — LACTATED RINGERS IV BOLUS
1000.0000 mL | Freq: Once | INTRAVENOUS | Status: AC
  Administered 2022-07-09: 1000 mL via INTRAVENOUS

## 2022-07-09 NOTE — Discharge Summary (Signed)
Physician Final Progress Note  Patient ID: Roberta Wagner MRN: [id number] DOB/AGE: 1981-06-08 41 y.o.  Admit date: 07/09/2022 Admitting provider: Linzie Collin, MD Discharge date: 07/09/2022   Admission Diagnoses:  1) intrauterine pregnancy at [redacted]w[redacted]d  2) uterine contractions  Discharge Diagnoses:  Principal Problem:   Uterine contractions  Not in labor   History of Present Illness: The patient is a 41 y.o. female [phone] at [redacted]w[redacted]d who presents for uterine contractions. Contractions started around 10 this morning.  She was seen for a ROB where she reported the contractions, she was found to be 3-4 cm.  She was told to go home and get her bags but was also advised she may remain home to monitor contractions for some time if she desired. Pt reports her contractions have continue to come regularly-every few minutes, the intensity remains about the same, endorses +FM, denies any LOF/VB but has had mucous like discharge. Had not had IC recently. Reports she does "drink a lot of water" and is very active, currently works as a Tourist information centre manager.   Pt is seen at [facility].  She has a hx c/s with her second child followed by a successful VBAC.   Past Medical History:  Diagnosis Date   Abnormal Pap smear of vagina 2012   colpo neg   Kidney stone    Vitamin D deficiency     Past Surgical History:  Procedure Laterality Date   CESAREAN SECTION  2014   kidney stone remove  2016    No current facility-administered medications on file prior to encounter.   Current Outpatient Medications on File Prior to Encounter  Medication Sig Dispense Refill   Prenatal Vit-Fe Fumarate-FA (MULTIVITAMIN-PRENATAL) 27-0.8 MG TABS tablet Take 1 tablet by mouth daily at 12 noon.     diphenhydrAMINE (BENADRYL) 25 MG tablet Take 1 tablet (25 mg total) by mouth every 6 (six) hours for 3 days. 12 tablet 0    Allergies  Allergen Reactions   Sulfa Antibiotics Hives    Social History   Socioeconomic History    Marital status: Married    Spouse name: Rory Percy.   Number of children: Not on file   Years of education: Not on file   Highest education level: Not on file  Occupational History   Not on file  Tobacco Use   Smoking status: Never   Smokeless tobacco: Never  Vaping Use   Vaping Use: Never used  Substance and Sexual Activity   Alcohol use: Not Currently   Drug use: No   Sexual activity: Yes    Birth control/protection: Surgical    Comment: BTL  Other Topics Concern   Not on file  Social History Narrative   Not on file   Social Determinants of Health   Financial Resource Strain: Not on file  Food Insecurity: Not on file  Transportation Needs: Not on file  Physical Activity: Not on file  Stress: Not on file  Social Connections: Not on file  Intimate Partner Violence: Not on file    Family History  Problem Relation Age of Onset   Hypertension Mother    Stroke Father    Cancer Maternal Grandfather    Diabetes Paternal Grandmother    Breast cancer Paternal Grandmother    Cancer Paternal Grandfather      Review of Systems  Constitutional: Negative.   Respiratory: Negative.    Cardiovascular: Negative.   Gastrointestinal:        Contraction pain   Genitourinary: Negative.  Neurological: Negative.   Psychiatric/Behavioral: Negative.       Physical Exam: BP 123/66   Pulse 76   Temp 98.4 F (36.9 C)   Resp 12   Ht 5\' 9"  (1.753 m)   Wt 91.6 kg   LMP 10/19/2021   BMI 29.83 kg/m   Physical Exam Constitutional:      Appearance: Normal appearance.  Genitourinary:     Vulva normal.     No vaginal discharge.  Pulmonary:     Effort: Pulmonary effort is normal.  Abdominal:     Comments: Gravid, non-tender BSUS confirms vertex presentation   Musculoskeletal:        General: Normal range of motion.     Cervical back: Normal range of motion.  Neurological:     General: No focal deficit present.     Mental Status: She is alert.  Skin:    General: Skin is warm  and dry.  Psychiatric:        Mood and Affect: Mood normal.    SVE 1.5/thick/high   EFM: baseline 135, moderate variability, pos accel, neg decel  Toco: occasional    Consults: None  Significant Findings/ Diagnostic Studies: RNST   Latest Reference Range & Units 07/09/22 14:45  Appearance CLEAR  HAZY !  Bilirubin Urine NEGATIVE  NEGATIVE  Color, Urine YELLOW  YELLOW !  Glucose, UA NEGATIVE mg/dL NEGATIVE  Hgb urine dipstick NEGATIVE  NEGATIVE  Ketones, ur NEGATIVE mg/dL NEGATIVE  Leukocytes,Ua NEGATIVE  LARGE !  Nitrite NEGATIVE  NEGATIVE  pH 5.0 - 8.0  7.0  Protein NEGATIVE mg/dL NEGATIVE  Specific Gravity, Urine 1.005 - 1.030  1.011  !: Data is abnormal Procedures: RNST   Hospital Course: The patient was admitted to Labor and Delivery Triage for observation. She received 1 liter of LR.  Over time her contractions went from every 2-4 minutes to occasionally.  Pt reports she is not feeling contractions as often and they vary in intensity.  Desires a VE and then to be sent home. There was no change in cervical exam.   Discharge Condition: stable  Disposition: Discharge disposition: 01-Home or Self Care       Diet: Regular diet  Discharge Activity: Activity as tolerated Reviewed belly binding, hydration and emptying bladder frequently.   Allergies as of 07/09/2022       Reactions   Sulfa Antibiotics Hives        Medication List     STOP taking these medications    diphenhydrAMINE 25 MG tablet Commonly known as: BENADRYL       TAKE these medications    multivitamin-prenatal 27-0.8 MG Tabs tablet Take 1 tablet by mouth daily at 12 noon.          Signed9/09/2022 Shahrzad Koble, CNM  07/09/2022, 3:48 PM

## 2022-07-09 NOTE — OB Triage Note (Signed)
Pt is a 40yo [id number] at [redacted]w[redacted]d that was sent over from the office for early labor. Pt was told she was 3-4cm in the office and she states she began contracting this morning around 1000 and they became regular. Pt denies VB, LOF and States positive FM. Pt is a previous C/S fir breech but has had a successful SVD since. CNM checked pt and she is 1.5cm/thick/-3. Pt given an IV fluid bolus for uterine activity. Will continue to monitor.

## 2022-07-09 NOTE — OB Triage Note (Signed)
Patient Discharged home per provider. Pt educated about labor precautions and informed when to return to the ED for further evaluation. Pt instructed to keep all follow up appointments with her provider. AVS given to patient and RN answered all questions and patient has no further questions at this time. Pt discharged home in stable condition with family.

## 2022-07-09 NOTE — Progress Notes (Signed)
ROB at [redacted]w[redacted]d. Active baby. Versia has been having painful ctx q2-3 minutes for the past hour. Denies LOF and vaginal bleeding. Desires SVE: 3-4/70/-2. Discussed options of heading to triage for labor evaluation, or going home for a little bit and then heading to hospital if ctx stay consistent. Given her history of fast labors and desire for epidural, Evangelina prefers to go get her belongings and then head over to L&D now. Knows where to go. Initial BP elevated. Repeat BP WNL. Report given to Carie Caddy, CNM.  [name], CNM

## 2022-07-10 ENCOUNTER — Ambulatory Visit (INDEPENDENT_AMBULATORY_CARE_PROVIDER_SITE_OTHER): Payer: Medicaid Other | Admitting: Obstetrics and Gynecology

## 2022-07-10 ENCOUNTER — Encounter: Payer: Self-pay | Admitting: Obstetrics and Gynecology

## 2022-07-10 VITALS — BP 114/65 | HR 100 | Wt 206.6 lb

## 2022-07-10 DIAGNOSIS — Z3009 Encounter for other general counseling and advice on contraception: Secondary | ICD-10-CM

## 2022-07-10 DIAGNOSIS — Z98891 History of uterine scar from previous surgery: Secondary | ICD-10-CM

## 2022-07-10 DIAGNOSIS — Z3A37 37 weeks gestation of pregnancy: Secondary | ICD-10-CM

## 2022-07-10 DIAGNOSIS — O99820 Streptococcus B carrier state complicating pregnancy: Secondary | ICD-10-CM | POA: Insufficient documentation

## 2022-07-10 DIAGNOSIS — Z3483 Encounter for supervision of other normal pregnancy, third trimester: Secondary | ICD-10-CM

## 2022-07-10 LAB — POCT URINALYSIS DIPSTICK OB
Bilirubin, UA: NEGATIVE
Blood, UA: NEGATIVE
Glucose, UA: NEGATIVE
Ketones, UA: NEGATIVE
Nitrite, UA: NEGATIVE
POC,PROTEIN,UA: NEGATIVE
Spec Grav, UA: 1.01 (ref 1.010–1.025)
Urobilinogen, UA: 0.2 E.U./dL
pH, UA: 7 (ref 5.0–8.0)

## 2022-07-10 NOTE — Progress Notes (Signed)
ROB: Patient with irregular uterine contractions, less frequent and strong as yesterday.  Was seen in triage yesterday due to ctx, cervix 3.5/70/-2 however no further change. No exam performed today.  Reiterated labor precautions. Is GBS+, will need abx for labor.  RTC in 1 week if undelivered.

## 2022-07-10 NOTE — Progress Notes (Signed)
ROB: She continues to have painful contraction as noted below. She was evaluated at L&D and then advised to follow up with you.

## 2022-07-12 ENCOUNTER — Encounter: Payer: Medicaid Other | Admitting: Obstetrics

## 2022-07-13 ENCOUNTER — Encounter: Payer: Self-pay | Admitting: Obstetrics and Gynecology

## 2022-07-13 ENCOUNTER — Other Ambulatory Visit: Payer: Self-pay

## 2022-07-13 ENCOUNTER — Observation Stay
Admission: EM | Admit: 2022-07-13 | Discharge: 2022-07-13 | Disposition: A | Discharge status: Home / Self Care | Payer: Medicaid Other | Attending: Obstetrics and Gynecology | Admitting: Obstetrics and Gynecology

## 2022-07-13 DIAGNOSIS — R109 Unspecified abdominal pain: Secondary | ICD-10-CM | POA: Diagnosis not present

## 2022-07-13 DIAGNOSIS — O471 False labor at or after 37 completed weeks of gestation: Secondary | ICD-10-CM | POA: Diagnosis present

## 2022-07-13 DIAGNOSIS — O26893 Other specified pregnancy related conditions, third trimester: Secondary | ICD-10-CM | POA: Diagnosis not present

## 2022-07-13 DIAGNOSIS — Z3A38 38 weeks gestation of pregnancy: Secondary | ICD-10-CM | POA: Diagnosis not present

## 2022-07-13 DIAGNOSIS — Z98891 History of uterine scar from previous surgery: Secondary | ICD-10-CM | POA: Diagnosis not present

## 2022-07-13 DIAGNOSIS — O479 False labor, unspecified: Secondary | ICD-10-CM | POA: Diagnosis present

## 2022-07-13 MED ORDER — LACTATED RINGERS IV BOLUS
500.0000 mL | Freq: Once | INTRAVENOUS | Status: AC
  Administered 2022-07-13: 500 mL via INTRAVENOUS

## 2022-07-13 NOTE — OB Triage Note (Signed)
Pt presents for Ctx since about 0800 that got worse around 1100. +FM. Denies bleeding, LOF. Rates pain 10/10 in lower abd and back. Denies any complications this pregnancy. Pt reports vaginal delivery, csection for breech, VBAC. CNM aware of pt.

## 2022-07-13 NOTE — Discharge Summary (Signed)
   Please see Final Progress note.  Mirna Mires, CNM  07/13/2022 2:49 PM

## 2022-07-13 NOTE — Final Progress Note (Signed)
Final Progress Note  Patient ID: Roberta Wagner MRN: [id number] DOB/AGE: 08-19-1981 41 y.o.  Admit date: 07/13/2022 Admitting provider: Mirna Mires, CNM Discharge date: 07/13/2022   Admission Diagnoses: uterine contractions [redacted] weeks gestation  Discharge Diagnoses:  Principal Problem:   Irregular uterine contractions  Latent phase labor Reactive fetal heart tones  History of Present Illness: The patient is a 41 y.o. female [phone] at [redacted]w[redacted]d who presents for a labor check. She began contracting earlier this afternoon, and within thirty minutes had arrived to the Labor and Delivery unit. She reports good fetl movement, denies any vaginal bleeding or leaking of fluid.Her hx is noted for previous CS delivery, followed by a VBAC.  Past Medical History:  Diagnosis Date   Abnormal Pap smear of vagina 2012   colpo neg   Kidney stone    Vitamin D deficiency     Past Surgical History:  Procedure Laterality Date   CESAREAN SECTION  2014   kidney stone remove  2016    No current facility-administered medications on file prior to encounter.   Current Outpatient Medications on File Prior to Encounter  Medication Sig Dispense Refill   Prenatal Vit-Fe Fumarate-FA (MULTIVITAMIN-PRENATAL) 27-0.8 MG TABS tablet Take 1 tablet by mouth daily at 12 noon.      Allergies  Allergen Reactions   Sulfa Antibiotics Hives    Social History   Socioeconomic History   Marital status: Married    Spouse name: Rory Percy.   Number of children: Not on file   Years of education: Not on file   Highest education level: Not on file  Occupational History   Not on file  Tobacco Use   Smoking status: Never   Smokeless tobacco: Never  Vaping Use   Vaping Use: Never used  Substance and Sexual Activity   Alcohol use: Not Currently   Drug use: No   Sexual activity: Yes    Birth control/protection: Surgical    Comment: BTL  Other Topics Concern   Not on file  Social History Narrative   Not on  file   Social Determinants of Health   Financial Resource Strain: Not on file  Food Insecurity: Not on file  Transportation Needs: Not on file  Physical Activity: Not on file  Stress: Not on file  Social Connections: Not on file  Intimate Partner Violence: Not on file    Family History  Problem Relation Age of Onset   Hypertension Mother    Stroke Father    Cancer Maternal Grandfather    Diabetes Paternal Grandmother    Breast cancer Paternal Grandmother    Cancer Paternal Grandfather      ROS   Physical Exam: BP (!) 130/58   Pulse 66   Temp 98.8 F (37.1 C) (Oral)   Resp 20   Ht 5\' 9"  (1.753 m)   Wt 92.1 kg   LMP 10/19/2021   BMI 29.98 kg/m   OBGyn Exam  Consults: None  Significant Findings/ Diagnostic Studies: labs: No results found for this or any previous visit (from the past 24 hour(s)).   Procedures: EFM NST Baseline FHR: 145 beats/min Variability: moderate Accelerations: present Decelerations: absent Tocometry: Mild contractions per palpation, initially frequent, and then over time, with po and IV fluid hydration, less frequent  Interpretation:  INDICATIONS: rule out uterine contractions RESULTS:  A NST procedure was performed with FHR monitoring and a normal baseline established, appropriate time of 20-40 minutes of evaluation, and accels >2 seen w 15x15 characteristics.  Results show a REACTIVE NST.    Hospital Course: The patient was admitted to Labor and Delivery Triage for observation. Her labor was monitored, and a Category 1 FHT tracing noted. Her cervix was first checked by the CNM and found to be 2-2.5 cms/thicker and the baby at a -3 station. After several hours of observation, there was no change in her cervix, and her contractions had diminished in frequency and strength per her report and per the EFM. She was thus discharged home to await active labor. SXS of active labor were reviewed with her.  Discharge Condition: good  Disposition:  Discharge disposition: 01-Home or Self Care       Diet: Regular diet  Discharge Activity: Activity as tolerated  Discharge Instructions     Fetal Kick Count:  Lie on our left side for one hour after a meal, and count the number of times your baby kicks.  If it is less than 5 times, get up, move around and drink some juice.  Repeat the test 30 minutes later.  If it is still less than 5 kicks in an hour, notify your doctor.   Complete by: As directed    LABOR:  When conractions begin, you should start to time them from the beginning of one contraction to the beginning  of the next.  When contractions are 5 - 10 minutes apart or less and have been regular for at least an hour, you should call your health care provider.   Complete by: As directed    Notify physician for bleeding from the vagina   Complete by: As directed    Notify physician for blurring of vision or spots before the eyes   Complete by: As directed    Notify physician for chills or fever   Complete by: As directed    Notify physician for fainting spells, "black outs" or loss of consciousness   Complete by: As directed    Notify physician for increase in vaginal discharge   Complete by: As directed    Notify physician for leaking of fluid   Complete by: As directed    Notify physician for pain or burning when urinating   Complete by: As directed    Notify physician for pelvic pressure (sudden increase)   Complete by: As directed    Notify physician for severe or continued nausea or vomiting   Complete by: As directed    Notify physician for sudden gushing of fluid from the vagina (with or without continued leaking)   Complete by: As directed    Notify physician for sudden, constant, or occasional abdominal pain   Complete by: As directed    Notify physician if baby moving less than usual   Complete by: As directed       Allergies as of 07/13/2022       Reactions   Sulfa Antibiotics Hives        Medication  List     TAKE these medications    multivitamin-prenatal 27-0.8 MG Tabs tablet Take 1 tablet by mouth daily at 12 noon.         Total time spent taking care of this patient: 40 minutes  Signed: Mirna Mires, CNM  07/13/2022, 2:50 PM

## 2022-07-19 ENCOUNTER — Encounter: Payer: Self-pay | Admitting: Obstetrics and Gynecology

## 2022-07-19 ENCOUNTER — Other Ambulatory Visit: Payer: Self-pay

## 2022-07-19 ENCOUNTER — Other Ambulatory Visit: Payer: Self-pay | Admitting: Obstetrics and Gynecology

## 2022-07-19 ENCOUNTER — Inpatient Hospital Stay: Payer: Medicaid Other | Admitting: Anesthesiology

## 2022-07-19 ENCOUNTER — Encounter: Payer: Medicaid Other | Admitting: Obstetrics and Gynecology

## 2022-07-19 ENCOUNTER — Inpatient Hospital Stay
Admission: EM | Admit: 2022-07-19 | Discharge: 2022-07-21 | DRG: 798 | Disposition: A | Discharge status: Home / Self Care | Payer: Medicaid Other | Attending: Obstetrics and Gynecology | Admitting: Obstetrics and Gynecology

## 2022-07-19 ENCOUNTER — Ambulatory Visit (INDEPENDENT_AMBULATORY_CARE_PROVIDER_SITE_OTHER): Payer: Medicaid Other | Admitting: Obstetrics and Gynecology

## 2022-07-19 VITALS — BP 113/54 | HR 79 | Wt 206.5 lb

## 2022-07-19 DIAGNOSIS — O99820 Streptococcus B carrier state complicating pregnancy: Secondary | ICD-10-CM

## 2022-07-19 DIAGNOSIS — O34219 Maternal care for unspecified type scar from previous cesarean delivery: Secondary | ICD-10-CM | POA: Diagnosis present

## 2022-07-19 DIAGNOSIS — O99824 Streptococcus B carrier state complicating childbirth: Secondary | ICD-10-CM | POA: Diagnosis present

## 2022-07-19 DIAGNOSIS — Z98891 History of uterine scar from previous surgery: Secondary | ICD-10-CM

## 2022-07-19 DIAGNOSIS — O479 False labor, unspecified: Secondary | ICD-10-CM

## 2022-07-19 DIAGNOSIS — O4292 Full-term premature rupture of membranes, unspecified as to length of time between rupture and onset of labor: Principal | ICD-10-CM | POA: Diagnosis present

## 2022-07-19 DIAGNOSIS — Z302 Encounter for sterilization: Secondary | ICD-10-CM

## 2022-07-19 DIAGNOSIS — Z3A39 39 weeks gestation of pregnancy: Secondary | ICD-10-CM

## 2022-07-19 DIAGNOSIS — O4202 Full-term premature rupture of membranes, onset of labor within 24 hours of rupture: Secondary | ICD-10-CM | POA: Diagnosis not present

## 2022-07-19 DIAGNOSIS — Z3009 Encounter for other general counseling and advice on contraception: Secondary | ICD-10-CM

## 2022-07-19 DIAGNOSIS — Z3483 Encounter for supervision of other normal pregnancy, third trimester: Secondary | ICD-10-CM

## 2022-07-19 LAB — POCT URINALYSIS DIPSTICK OB
Bilirubin, UA: NEGATIVE
Glucose, UA: NEGATIVE
Ketones, UA: NEGATIVE
Nitrite, UA: NEGATIVE
POC,PROTEIN,UA: NEGATIVE
Spec Grav, UA: >=1.03 — AB (ref 1.010–1.025)
Urobilinogen, UA: 0.2 E.U./dL
pH, UA: 6 (ref 5.0–8.0)

## 2022-07-19 LAB — CBC
HCT: 33.5 % — ABNORMAL LOW (ref 36.0–46.0)
Hemoglobin: 11 g/dL — ABNORMAL LOW (ref 12.0–15.0)
MCH: 27.2 pg (ref 26.0–34.0)
MCHC: 32.8 g/dL (ref 30.0–36.0)
MCV: 82.7 fL (ref 80.0–100.0)
Platelets: 263 10*3/uL (ref 150–400)
RBC: 4.05 MIL/uL (ref 3.87–5.11)
RDW: 12.7 % (ref 11.5–15.5)
WBC: 7.9 10*3/uL (ref 4.0–10.5)
nRBC: 0 % (ref 0.0–0.2)

## 2022-07-19 LAB — TYPE AND SCREEN
ABO/RH(D): O POS
Antibody Screen: NEGATIVE

## 2022-07-19 MED ORDER — FENTANYL-BUPIVACAINE-NACL 0.5-0.125-0.9 MG/250ML-% EP SOLN
12.0000 mL/h | EPIDURAL | Status: DC | PRN
  Administered 2022-07-19: 12 mL/h via EPIDURAL

## 2022-07-19 MED ORDER — TERBUTALINE SULFATE 1 MG/ML IJ SOLN: 0.2500 mg | Freq: Once | INTRAMUSCULAR | Status: DC | PRN

## 2022-07-19 MED ORDER — MISOPROSTOL 200 MCG PO TABS
ORAL_TABLET | ORAL | Status: AC
  Filled 2022-07-19: qty 4

## 2022-07-19 MED ORDER — SODIUM CHLORIDE 0.9 % IV SOLN
INTRAVENOUS | Status: DC | PRN
  Administered 2022-07-19: 9 mL via EPIDURAL

## 2022-07-19 MED ORDER — LIDOCAINE HCL (PF) 1 % IJ SOLN: 30.0000 mL | INTRAMUSCULAR | Status: DC | PRN

## 2022-07-19 MED ORDER — SOD CITRATE-CITRIC ACID 500-334 MG/5ML PO SOLN: 30.0000 mL | ORAL | Status: DC | PRN

## 2022-07-19 MED ORDER — PENICILLIN G POTASSIUM 5000000 UNITS IJ SOLR
5.0000 10*6.[IU] | Freq: Once | INTRAMUSCULAR | Status: DC
  Filled 2022-07-19: qty 5

## 2022-07-19 MED ORDER — FAMOTIDINE 20 MG PO TABS: 40.0000 mg | ORAL_TABLET | Freq: Once | ORAL | Status: DC

## 2022-07-19 MED ORDER — DIPHENHYDRAMINE HCL 25 MG PO CAPS: 25.0000 mg | ORAL_CAPSULE | Freq: Four times a day (QID) | ORAL | Status: DC | PRN

## 2022-07-19 MED ORDER — OXYCODONE-ACETAMINOPHEN 5-325 MG PO TABS: 1.0000 | ORAL_TABLET | ORAL | Status: DC | PRN

## 2022-07-19 MED ORDER — ONDANSETRON HCL 4 MG PO TABS: 4.0000 mg | ORAL_TABLET | ORAL | Status: DC | PRN

## 2022-07-19 MED ORDER — AMMONIA AROMATIC IN INHA
RESPIRATORY_TRACT | Status: AC
  Filled 2022-07-19: qty 10

## 2022-07-19 MED ORDER — PENICILLIN G POTASSIUM 5000000 UNITS IJ SOLR: 5.0000 10*6.[IU] | Freq: Once | INTRAMUSCULAR | Status: DC

## 2022-07-19 MED ORDER — OXYTOCIN-SODIUM CHLORIDE 30-0.9 UT/500ML-% IV SOLN
1.0000 m[IU]/min | INTRAVENOUS | Status: DC
  Administered 2022-07-19: 2 m[IU]/min via INTRAVENOUS
  Filled 2022-07-19: qty 500

## 2022-07-19 MED ORDER — PRENATAL MULTIVITAMIN CH
1.0000 | ORAL_TABLET | Freq: Every day | ORAL | Status: DC
  Administered 2022-07-20 – 2022-07-21 (×2): 1 via ORAL
  Filled 2022-07-19 (×2): qty 1

## 2022-07-19 MED ORDER — DIPHENHYDRAMINE HCL 50 MG/ML IJ SOLN: 12.5000 mg | INTRAMUSCULAR | Status: DC | PRN

## 2022-07-19 MED ORDER — LIDOCAINE HCL (PF) 1 % IJ SOLN
INTRAMUSCULAR | Status: DC | PRN
  Administered 2022-07-19: 3 mL via SUBCUTANEOUS

## 2022-07-19 MED ORDER — SODIUM CHLORIDE 0.9 % IV SOLN
INTRAVENOUS | Status: AC
  Filled 2022-07-19: qty 5

## 2022-07-19 MED ORDER — FENTANYL-BUPIVACAINE-NACL 0.5-0.125-0.9 MG/250ML-% EP SOLN
EPIDURAL | Status: AC
  Filled 2022-07-19: qty 250

## 2022-07-19 MED ORDER — PHENYLEPHRINE 80 MCG/ML (10ML) SYRINGE FOR IV PUSH (FOR BLOOD PRESSURE SUPPORT): 80.0000 ug | PREFILLED_SYRINGE | INTRAVENOUS | Status: DC | PRN

## 2022-07-19 MED ORDER — PENICILLIN G POTASSIUM 5000000 UNITS IJ SOLR: 3.0000 10*6.[IU] | INTRAMUSCULAR | Status: DC

## 2022-07-19 MED ORDER — PENICILLIN G POT IN DEXTROSE 60000 UNIT/ML IV SOLN: 3.0000 10*6.[IU] | INTRAVENOUS | Status: DC

## 2022-07-19 MED ORDER — WITCH HAZEL-GLYCERIN EX PADS
1.0000 | MEDICATED_PAD | CUTANEOUS | Status: DC | PRN
Start: 2022-07-19 — End: 2022-07-21
  Administered 2022-07-20: 1 via TOPICAL
  Filled 2022-07-19: qty 100

## 2022-07-19 MED ORDER — LIDOCAINE-EPINEPHRINE (PF) 1.5 %-1:200000 IJ SOLN
INTRAMUSCULAR | Status: DC | PRN
  Administered 2022-07-19: 3 mL via EPIDURAL

## 2022-07-19 MED ORDER — IBUPROFEN 600 MG PO TABS
600.0000 mg | ORAL_TABLET | Freq: Four times a day (QID) | ORAL | Status: DC
  Administered 2022-07-20 – 2022-07-21 (×6): 600 mg via ORAL
  Filled 2022-07-19 (×6): qty 1

## 2022-07-19 MED ORDER — LACTATED RINGERS IV SOLN
INTRAVENOUS | Status: DC
Start: 2022-07-20 — End: 2022-07-21

## 2022-07-19 MED ORDER — EPHEDRINE 5 MG/ML INJ
10.0000 mg | INTRAVENOUS | Status: AC | PRN
  Administered 2022-07-19 (×2): 10 mg via INTRAVENOUS

## 2022-07-19 MED ORDER — ZOLPIDEM TARTRATE 5 MG PO TABS: 5.0000 mg | ORAL_TABLET | Freq: Every evening | ORAL | Status: DC | PRN

## 2022-07-19 MED ORDER — BENZOCAINE-MENTHOL 20-0.5 % EX AERO
1.0000 | INHALATION_SPRAY | CUTANEOUS | Status: DC | PRN
  Administered 2022-07-20: 1 via TOPICAL
  Filled 2022-07-19: qty 56

## 2022-07-19 MED ORDER — PENICILLIN G POT IN DEXTROSE 60000 UNIT/ML IV SOLN
INTRAVENOUS | Status: AC
  Administered 2022-07-19: 3 10*6.[IU]
  Filled 2022-07-19: qty 50

## 2022-07-19 MED ORDER — SENNOSIDES-DOCUSATE SODIUM 8.6-50 MG PO TABS
2.0000 | ORAL_TABLET | Freq: Every day | ORAL | Status: DC
  Administered 2022-07-20 – 2022-07-21 (×2): 2 via ORAL
  Filled 2022-07-19 (×2): qty 2

## 2022-07-19 MED ORDER — SIMETHICONE 80 MG PO CHEW
80.0000 mg | CHEWABLE_TABLET | ORAL | Status: DC | PRN
  Administered 2022-07-20: 80 mg via ORAL

## 2022-07-19 MED ORDER — EPHEDRINE 5 MG/ML INJ
10.0000 mg | INTRAVENOUS | Status: DC | PRN
  Administered 2022-07-19: 10 mg via INTRAVENOUS
  Filled 2022-07-19 (×2): qty 5

## 2022-07-19 MED ORDER — OXYTOCIN-SODIUM CHLORIDE 30-0.9 UT/500ML-% IV SOLN
2.5000 [IU]/h | INTRAVENOUS | Status: DC
  Administered 2022-07-19: 2.5 [IU]/h via INTRAVENOUS

## 2022-07-19 MED ORDER — OXYTOCIN BOLUS FROM INFUSION
333.0000 mL | Freq: Once | INTRAVENOUS | Status: DC
  Administered 2022-07-19: 333 mL via INTRAVENOUS

## 2022-07-19 MED ORDER — ONDANSETRON HCL 4 MG/2ML IJ SOLN
4.0000 mg | INTRAMUSCULAR | Status: DC | PRN
  Administered 2022-07-20: 4 mg via INTRAVENOUS

## 2022-07-19 MED ORDER — OXYTOCIN 10 UNIT/ML IJ SOLN
INTRAMUSCULAR | Status: AC
  Filled 2022-07-19: qty 2

## 2022-07-19 MED ORDER — FENTANYL CITRATE (PF) 100 MCG/2ML IJ SOLN: 50.0000 ug | INTRAMUSCULAR | Status: DC | PRN

## 2022-07-19 MED ORDER — ACETAMINOPHEN 325 MG PO TABS
650.0000 mg | ORAL_TABLET | ORAL | Status: DC | PRN
  Administered 2022-07-20 – 2022-07-21 (×5): 650 mg via ORAL
  Filled 2022-07-19 (×5): qty 2

## 2022-07-19 MED ORDER — METOCLOPRAMIDE HCL 10 MG PO TABS
10.0000 mg | ORAL_TABLET | Freq: Once | ORAL | Status: DC
  Filled 2022-07-19: qty 1

## 2022-07-19 MED ORDER — ONDANSETRON HCL 4 MG/2ML IJ SOLN: 4.0000 mg | Freq: Four times a day (QID) | INTRAMUSCULAR | Status: DC | PRN

## 2022-07-19 MED ORDER — LACTATED RINGERS IV SOLN
500.0000 mL | INTRAVENOUS | Status: DC | PRN
  Administered 2022-07-19: 1000 mL via INTRAVENOUS
  Administered 2022-07-19: 800 mL via INTRAVENOUS
  Administered 2022-07-19: 125 mL via INTRAVENOUS

## 2022-07-19 MED ORDER — LIDOCAINE HCL (PF) 1 % IJ SOLN
INTRAMUSCULAR | Status: AC
  Filled 2022-07-19: qty 30

## 2022-07-19 MED ORDER — ACETAMINOPHEN 325 MG PO TABS: 650.0000 mg | ORAL_TABLET | ORAL | Status: DC | PRN

## 2022-07-19 MED ORDER — LACTATED RINGERS IV SOLN
500.0000 mL | Freq: Once | INTRAVENOUS | Status: AC
  Administered 2022-07-19: 500 mL via INTRAVENOUS

## 2022-07-19 MED ORDER — COCONUT OIL OIL
1.0000 | TOPICAL_OIL | Status: DC | PRN
Start: 2022-07-19 — End: 2022-07-21

## 2022-07-19 MED ORDER — DIBUCAINE (PERIANAL) 1 % EX OINT
1.0000 | TOPICAL_OINTMENT | CUTANEOUS | Status: DC | PRN
Start: 2022-07-19 — End: 2022-07-21
  Administered 2022-07-20: 1 via RECTAL
  Filled 2022-07-19: qty 28

## 2022-07-19 MED ORDER — OXYCODONE-ACETAMINOPHEN 5-325 MG PO TABS: 2.0000 | ORAL_TABLET | ORAL | Status: DC | PRN

## 2022-07-19 NOTE — Progress Notes (Signed)
Intrapartum Progress Note  S: Patient breathing through contractions, desires epidural.   O: Blood pressure (!) 108/56, pulse (!) 101, temperature 98.3 F (36.8 C), temperature source Oral, resp. rate 16, height 5\' 9"  (1.753 m), weight 93.4 kg, last menstrual period 10/19/2021, unknown if currently breastfeeding. Gen App: NAD, comfortable Abdomen: soft, gravid FHT: baseline 140 bpm.  Accels present.  Decels present - intermittent variables . moderate in degree variability.   Tocometer: contractions q 2-4 minutes Cervix: 3/70/-2 Extremities: Nontender, no edema.  Pitocin: None  Labs:  No new labs  Assessment:  1: SIUP at [redacted]w[redacted]d 2. SROM 3. H/o C/S x 1 with successful VBAC. Desires TOLAC 4. GBS+ 5. Undesired fertility  Plan:  Plan for augmentation with Pitocin after epidural placed. GBS+, has received second dose of PCN Anesthesia aware of TOLAC status Anticipate vaginal delivery Desires BTL postpartum.    [redacted]w[redacted]d, MD 07/19/2022 7:15 PM

## 2022-07-19 NOTE — Progress Notes (Signed)
ROB: Patient notes she has been experiencing irregular contractions this morning, more intense. Also feeling more pain and pressure.  Notes she is "over being pregnant". Has been contracting for the past 2 weeks which has been miserable, but no further cervical change. Offered membrane sweeping today. Given labor precautions. Patient's partner has questions regarding delayed cord clamping.  Questions answered.

## 2022-07-19 NOTE — H&P (Signed)
Obstetric History and Physical  Roberta Wagner is a 41 y.o. [id number] with IUP at [redacted]w[redacted]d presenting for PROM. Patient states she has been having  irregular contractions x 2 weeks, intensifying after membrane sweeping performed in office today.  Notes that ~ 2 hrs after her visit, she and husband were running errands and her water broke in the car.  She denies vaginal bleeding, intact membranes, with active fetal movement.    Patient with h/o C-section x 1 (breech) followed by successful VBAC. Plans trial of labor with this pregnancy as well.   Prenatal Course Source of Care: [facility] with onset of care at 13 weeks Pregnancy complications or risks: Patient Active Problem List   Diagnosis Date Noted   Full-term premature rupture of membranes 07/19/2022   Irregular uterine contractions 07/13/2022   GBS (group B Streptococcus carrier), +RV culture, currently pregnant 07/10/2022   History of VBAC 07/10/2022   Unwanted fertility 07/10/2022   Uterine contractions 07/09/2022   Encounter for preoperative assessment 10/29/2020   Gastroenteritis 01/06/2017   Preterm uterine contractions 12/27/2016   Pap smear abnormality of cervix with ASCUS favoring benign 08/17/2016   Amenorrhea 07/10/2016   History of cesarean section 07/10/2016   She plans to breastfeed She desires bilateral tubal ligation for postpartum contraception.   Prenatal labs and studies: ABO, Rh: --/--/O POS (07/20 1358) Antibody: NEG (07/20 1358) Rubella: <20.0 (01/03 1040) RPR: Non Reactive (05/03 1413)  HBsAg:    HIV: Non Reactive (01/03 1040)  ASN:[id number]/-- (06/28 1653) 1 hr Glucola  normal Genetic screening normal Anatomy US normal   Past Medical History:  Diagnosis Date   Abnormal Pap smear of vagina 2012   colpo neg   Kidney stone    Vitamin D deficiency     Past Surgical History:  Procedure Laterality Date   CESAREAN SECTION  2014   kidney stone remove  2016    OB History  Gravida Para  Term Preterm AB Living  6 3 3   2 3   SAB IAB Ectopic Multiple Live Births  1     0 3    # Outcome Date GA Lbr Len/2nd Weight Sex Delivery Anes PTL Lv  6 Current           5 Term 03/05/17 [redacted]w[redacted]d / 00:12 3450 g M VBAC EPI  LIV  4 SAB 2016          3 AB 2014          2 Term 2014 [redacted]w[redacted]d  3225 g F CS-LTranv   LIV     Complications: Breech birth  1 Term 2012 [redacted]w[redacted]d  3447 g F Vag-Spont   LIV    Social History   Socioeconomic History   Marital status: Married    Spouse name: [redacted]w[redacted]d.   Number of children: Not on file   Years of education: Not on file   Highest education level: Not on file  Occupational History   Not on file  Tobacco Use   Smoking status: Never   Smokeless tobacco: Never  Vaping Use   Vaping Use: Never used  Substance and Sexual Activity   Alcohol use: Not Currently   Drug use: No   Sexual activity: Yes    Birth control/protection: Surgical    Comment: BTL  Other Topics Concern   Not on file  Social History Narrative   Not on file   Social Determinants of Health   Financial Resource Strain: Not on file  Food  Insecurity: Not on file  Transportation Needs: Not on file  Physical Activity: Not on file  Stress: Not on file  Social Connections: Not on file    Family History  Problem Relation Age of Onset   Hypertension Mother    Stroke Father    Cancer Maternal Grandfather    Diabetes Paternal Grandmother    Breast cancer Paternal Grandmother    Cancer Paternal Grandfather     Medications Prior to Admission  Medication Sig Dispense Refill Last Dose   Prenatal Vit-Fe Fumarate-FA (MULTIVITAMIN-PRENATAL) 27-0.8 MG TABS tablet Take 1 tablet by mouth daily at 12 noon.   07/18/2022    Allergies  Allergen Reactions   Sulfa Antibiotics Hives    Review of Systems: Negative except for what is mentioned in HPI.  Physical Exam: BP 127/72   Pulse 84   Temp 98.2 F (36.8 C) (Oral)   Resp 16   Ht 5\' 9"  (1.753 m)   Wt 93.4 kg   LMP 10/19/2021   BMI 30.42  kg/m  CONSTITUTIONAL: Well-developed, well-nourished female in no acute distress.  HENT:  Normocephalic, atraumatic, External right and left ear normal. Oropharynx is clear and moist EYES: Conjunctivae and EOM are normal. Pupils are equal, round, and reactive to light. No scleral icterus.  NECK: Normal range of motion, supple, no masses SKIN: Skin is warm and dry. No rash noted. Not diaphoretic. No erythema. No pallor. NEUROLOGIC: Alert and oriented to person, place, and time. Normal reflexes, muscle tone coordination. No cranial nerve deficit noted. PSYCHIATRIC: Normal mood and affect. Normal behavior. Normal judgment and thought content. CARDIOVASCULAR: Normal heart rate noted, regular rhythm RESPIRATORY: Effort and breath sounds normal, no problems with respiration noted ABDOMEN: Soft, nontender, nondistended, gravid. MUSCULOSKELETAL: Normal range of motion. No edema and no tenderness. 2+ distal pulses.  Cervical Exam: Dilatation 2 cm   Effacement 50%   Station -3   Presentation: cephalic FHT:  Baseline rate 135 bpm   Variability moderate  Accelerations present   Decelerations none Contractions: Every 4-5 mins   Pertinent Labs/Studies:   Results for orders placed or performed during the hospital encounter of 07/19/22 (from the past 24 hour(s))  CBC     Status: Abnormal   Collection Time: 07/19/22  1:58 PM  Result Value Ref Range   WBC 7.9 4.0 - 10.5 K/uL   RBC 4.05 3.87 - 5.11 MIL/uL   Hemoglobin 11.0 (L) 12.0 - 15.0 g/dL   HCT 07/21/22 (L) 36.6 - 44.0 %   MCV 82.7 80.0 - 100.0 fL   MCH 27.2 26.0 - 34.0 pg   MCHC 32.8 30.0 - 36.0 g/dL   RDW 34.7 42.5 - 95.6 %   Platelets 263 150 - 400 K/uL   nRBC 0.0 0.0 - 0.2 %  Type and screen [facility]     Status: None   Collection Time: 07/19/22  1:58 PM  Result Value Ref Range   ABO/RH(D) O POS    Antibody Screen NEG    Sample Expiration      07/22/2022,2359 Performed at [facility], [address],  [place], [place]     Assessment : Roberta Wagner is a 41 y.o. 20 at [redacted]w[redacted]d being admitted for PROM. H/o C/S x 1, with successful VBAC, desiring TOLAC. GBS positive. Undesired fertility.   Plan: Labor: Expectant management. Augmentation with Pitocin if needed per protocol. Analgesia as needed. FWB: Reassuring fetal heart tracing.  GBS positive. PCN for prophylaxis. Delivery plan: Hopeful  for vaginal delivery. Will plan for BTL postpartum.      Hildred Laser, MD [facility]

## 2022-07-19 NOTE — Progress Notes (Signed)
ROB. Patient states  Patient states she was experiencing contractions this morning that she state felt regular, pain/pressure

## 2022-07-19 NOTE — Anesthesia Preprocedure Evaluation (Signed)
Anesthesia Evaluation  Patient identified by MRN, date of birth, ID band Patient awake    Reviewed: Allergy & Precautions, NPO status , Patient's Chart, lab work & pertinent test results  History of Anesthesia Complications Negative for: history of anesthetic complications  Airway Mallampati: I  TM Distance: >3 FB Neck ROM: Full    Dental no notable dental hx. (+) Teeth Intact   Pulmonary neg pulmonary ROS, neg sleep apnea, neg COPD, Patient abstained from smoking.Not current smoker,    Pulmonary exam normal breath sounds clear to auscultation       Cardiovascular Exercise Tolerance: Good METS(-) hypertension(-) CAD and (-) Past MI negative cardio ROS  (-) dysrhythmias  Rhythm:Regular Rate:Normal - Systolic murmurs    Neuro/Psych negative neurological ROS  negative psych ROS   GI/Hepatic neg GERD  ,(+)     (-) substance abuse  ,   Endo/Other  neg diabetes  Renal/GU negative Renal ROS     Musculoskeletal   Abdominal   Peds  Hematology   Anesthesia Other Findings Past Medical History: 2012: Abnormal Pap smear of vagina     Comment:  colpo neg No date: Kidney stone No date: Vitamin D deficiency  Reproductive/Obstetrics (+) Pregnancy                             Anesthesia Physical Anesthesia Plan  ASA: 2  Anesthesia Plan: Epidural   Post-op Pain Management:    Induction:   PONV Risk Score and Plan: 2 and Treatment may vary due to age or medical condition and Ondansetron  Airway Management Planned: Natural Airway  Additional Equipment:   Intra-op Plan:   Post-operative Plan:   Informed Consent: I have reviewed the patients History and Physical, chart, labs and discussed the procedure including the risks, benefits and alternatives for the proposed anesthesia with the patient or authorized representative who has indicated his/her understanding and acceptance.        Plan Discussed with: Surgeon  Anesthesia Plan Comments: (Discussed R/B/A of neuraxial anesthesia technique with patient: - rare risks of spinal/epidural hematoma, nerve damage, infection - Risk of PDPH - Risk of itching - Risk of nausea and vomiting - Risk of poor block necessitating replacement of epidural. - Risk of allergic reactions. Patient voiced understanding.)        Anesthesia Quick Evaluation

## 2022-07-19 NOTE — Progress Notes (Signed)
Called to attend delivery by primary RN d/t imminent birth and Dr. Valentino Saxon still en route. Patient is reclining, feet in stirrups, and breathing through contractions, fetal vertex crowning.  Delivery of a viable female infant 07/19/2022 at 2244 by Donato Schultz, CNM. delivery of fetal head in OA position with restitution to ROA. Loose nuchal cord;  Anterior then posterior shoulders delivered easily with gentle downward traction. Baby placed on mom's chest, and attended to by peds.  Dr. Valentino Saxon to room shortly after infant delivery.  Janyce Llanos, CNM 07/19/2022 10:54 PM

## 2022-07-19 NOTE — Anesthesia Procedure Notes (Signed)
Epidural Patient location during procedure: OB  Staffing Anesthesiologist: Azhar Knope, MD Performed: anesthesiologist   Preanesthetic Checklist Completed: patient identified, IV checked, site marked, risks and benefits discussed, surgical consent, monitors and equipment checked, pre-op evaluation and timeout performed  Epidural Patient position: sitting Prep: ChloraPrep Patient monitoring: heart rate, continuous pulse ox and blood pressure Approach: midline Location: L3-L4 Injection technique: LOR saline  Needle:  Needle type: Tuohy  Needle gauge: 17 G Needle length: 9 cm Needle insertion depth: 6 cm Catheter type: closed end flexible Catheter size: 19 Gauge Catheter at skin depth: 11 cm Test dose: negative and 1.5% lidocaine with Epi 1:200 K  Assessment Sensory level: T10 Events: blood not aspirated, injection not painful, no injection resistance, no paresthesia and negative IV test  Additional Notes first attempt Pt. Evaluated and documentation done after procedure finished. Patient identified. Risks/Benefits/Options discussed with patient including but not limited to bleeding, infection, nerve damage, paralysis, failed block, incomplete pain control, headache, blood pressure changes, nausea, vomiting, reactions to medication both or allergic, itching and postpartum back pain. Confirmed with bedside nurse the patient's most recent platelet count. Confirmed with patient that they are not currently taking any anticoagulation, have any bleeding history or any family history of bleeding disorders. Patient expressed understanding and wished to proceed. All questions were answered. Sterile technique was used throughout the entire procedure. Please see nursing notes for vital signs. Test dose was given through epidural catheter and negative prior to continuing to dose epidural or start infusion. Warning signs of high block given to the patient including shortness of breath,  tingling/numbness in hands, complete motor block, or any concerning symptoms with instructions to call for help. Patient was given instructions on fall risk and not to get out of bed. All questions and concerns addressed with instructions to call with any issues or inadequate analgesia.     Patient tolerated the insertion well without immediate complications.  Reason for block: procedure for painReason for block:procedure for pain     

## 2022-07-20 ENCOUNTER — Inpatient Hospital Stay: Payer: Medicaid Other | Admitting: Anesthesiology

## 2022-07-20 ENCOUNTER — Other Ambulatory Visit: Payer: Self-pay

## 2022-07-20 ENCOUNTER — Encounter: Payer: Self-pay | Admitting: Obstetrics and Gynecology

## 2022-07-20 ENCOUNTER — Encounter
Admission: EM | Disposition: A | Discharge status: Home / Self Care | Payer: Self-pay | Source: Home / Self Care | Attending: Obstetrics and Gynecology

## 2022-07-20 HISTORY — PX: TUBAL LIGATION: SHX77

## 2022-07-20 LAB — CBC
HCT: 34.3 % — ABNORMAL LOW (ref 36.0–46.0)
Hemoglobin: 11.3 g/dL — ABNORMAL LOW (ref 12.0–15.0)
MCH: 27.1 pg (ref 26.0–34.0)
MCHC: 32.9 g/dL (ref 30.0–36.0)
MCV: 82.3 fL (ref 80.0–100.0)
Platelets: 258 10*3/uL (ref 150–400)
RBC: 4.17 MIL/uL (ref 3.87–5.11)
RDW: 12.6 % (ref 11.5–15.5)
WBC: 13.5 10*3/uL — ABNORMAL HIGH (ref 4.0–10.5)
nRBC: 0 % (ref 0.0–0.2)

## 2022-07-20 LAB — RPR: RPR Ser Ql: NONREACTIVE

## 2022-07-20 SURGERY — LIGATION, FALLOPIAN TUBE, POSTPARTUM
Anesthesia: General | Site: Abdomen

## 2022-07-20 MED ORDER — ACETAMINOPHEN 10 MG/ML IV SOLN
INTRAVENOUS | Status: DC | PRN
  Administered 2022-07-20: 1000 mg via INTRAVENOUS

## 2022-07-20 MED ORDER — PHENYLEPHRINE 80 MCG/ML (10ML) SYRINGE FOR IV PUSH (FOR BLOOD PRESSURE SUPPORT)
PREFILLED_SYRINGE | INTRAVENOUS | Status: AC
  Filled 2022-07-20: qty 10

## 2022-07-20 MED ORDER — PROPOFOL 10 MG/ML IV BOLUS
INTRAVENOUS | Status: AC
  Filled 2022-07-20: qty 20

## 2022-07-20 MED ORDER — MIDAZOLAM HCL 2 MG/2ML IJ SOLN
INTRAMUSCULAR | Status: AC
  Filled 2022-07-20: qty 2

## 2022-07-20 MED ORDER — ACETAMINOPHEN 10 MG/ML IV SOLN
INTRAVENOUS | Status: AC
  Filled 2022-07-20: qty 100

## 2022-07-20 MED ORDER — SUGAMMADEX SODIUM 200 MG/2ML IV SOLN
INTRAVENOUS | Status: DC | PRN
  Administered 2022-07-20: 200 mg via INTRAVENOUS

## 2022-07-20 MED ORDER — ROCURONIUM BROMIDE 100 MG/10ML IV SOLN
INTRAVENOUS | Status: DC | PRN
  Administered 2022-07-20: 20 mg via INTRAVENOUS
  Administered 2022-07-20: 10 mg via INTRAVENOUS

## 2022-07-20 MED ORDER — LIDOCAINE HCL (CARDIAC) PF 100 MG/5ML IV SOSY
PREFILLED_SYRINGE | INTRAVENOUS | Status: DC | PRN
  Administered 2022-07-20: 80 mg via INTRAVENOUS

## 2022-07-20 MED ORDER — OXYCODONE HCL 5 MG PO TABS
5.0000 mg | ORAL_TABLET | ORAL | Status: DC | PRN
  Administered 2022-07-20 – 2022-07-21 (×4): 5 mg via ORAL
  Filled 2022-07-20 (×4): qty 1

## 2022-07-20 MED ORDER — FENTANYL CITRATE (PF) 100 MCG/2ML IJ SOLN: 25.0000 ug | INTRAMUSCULAR | Status: DC | PRN

## 2022-07-20 MED ORDER — DEXAMETHASONE SODIUM PHOSPHATE 10 MG/ML IJ SOLN
INTRAMUSCULAR | Status: DC | PRN
  Administered 2022-07-20: 10 mg via INTRAVENOUS

## 2022-07-20 MED ORDER — FENTANYL CITRATE (PF) 100 MCG/2ML IJ SOLN
INTRAMUSCULAR | Status: DC | PRN
  Administered 2022-07-20: 25 ug via INTRAVENOUS
  Administered 2022-07-20: 50 ug via INTRAVENOUS
  Administered 2022-07-20: 25 ug via INTRAVENOUS

## 2022-07-20 MED ORDER — OXYCODONE HCL 5 MG PO TABS
5.0000 mg | ORAL_TABLET | Freq: Once | ORAL | Status: AC | PRN
  Administered 2022-07-20: 5 mg via ORAL
  Filled 2022-07-20: qty 1

## 2022-07-20 MED ORDER — 0.9 % SODIUM CHLORIDE (POUR BTL) OPTIME
TOPICAL | Status: DC | PRN
  Administered 2022-07-20: 500 mL

## 2022-07-20 MED ORDER — SUCCINYLCHOLINE CHLORIDE 200 MG/10ML IV SOSY
PREFILLED_SYRINGE | INTRAVENOUS | Status: DC | PRN
  Administered 2022-07-20: 100 mg via INTRAVENOUS

## 2022-07-20 MED ORDER — KETOROLAC TROMETHAMINE 30 MG/ML IJ SOLN
INTRAMUSCULAR | Status: DC | PRN
  Administered 2022-07-20: 30 mg via INTRAVENOUS

## 2022-07-20 MED ORDER — MIDAZOLAM HCL 2 MG/2ML IJ SOLN
INTRAMUSCULAR | Status: DC | PRN
  Administered 2022-07-20 (×2): 1 mg via INTRAVENOUS

## 2022-07-20 MED ORDER — FENTANYL CITRATE (PF) 100 MCG/2ML IJ SOLN
INTRAMUSCULAR | Status: AC
  Filled 2022-07-20: qty 2

## 2022-07-20 MED ORDER — OXYCODONE HCL 5 MG/5ML PO SOLN: 5.0000 mg | Freq: Once | ORAL | Status: DC | PRN

## 2022-07-20 MED ORDER — PROPOFOL 10 MG/ML IV BOLUS
INTRAVENOUS | Status: DC | PRN
  Administered 2022-07-20: 150 mg via INTRAVENOUS

## 2022-07-20 MED ORDER — PHENYLEPHRINE 80 MCG/ML (10ML) SYRINGE FOR IV PUSH (FOR BLOOD PRESSURE SUPPORT)
PREFILLED_SYRINGE | INTRAVENOUS | Status: DC | PRN
  Administered 2022-07-20: 160 ug via INTRAVENOUS
  Administered 2022-07-20: 80 ug via INTRAVENOUS

## 2022-07-20 MED ORDER — BUPIVACAINE HCL 0.5 % IJ SOLN
INTRAMUSCULAR | Status: DC | PRN
  Administered 2022-07-20: 20 mL

## 2022-07-20 SURGICAL SUPPLY — 28 items
BLADE SURG SZ11 CARB STEEL (BLADE) ×2 IMPLANT
CHLORAPREP W/TINT 26 (MISCELLANEOUS) ×2 IMPLANT
DERMABOND ADVANCED (GAUZE/BANDAGES/DRESSINGS) ×1
DERMABOND ADVANCED .7 DNX12 (GAUZE/BANDAGES/DRESSINGS) ×1 IMPLANT
DRAPE LAPAROTOMY 100X77 ABD (DRAPES) ×2 IMPLANT
GAUZE 4X4 16PLY ~~LOC~~+RFID DBL (SPONGE) ×2 IMPLANT
GLOVE BIO SURGEON STRL SZ 6.5 (GLOVE) ×2 IMPLANT
GLOVE SURG UNDER LTX SZ7 (GLOVE) ×2 IMPLANT
GOWN STRL REUS W/ TWL LRG LVL3 (GOWN DISPOSABLE) ×2 IMPLANT
GOWN STRL REUS W/TWL LRG LVL3 (GOWN DISPOSABLE) ×2
KIT TURNOVER CYSTO (KITS) ×2 IMPLANT
MANIFOLD NEPTUNE II (INSTRUMENTS) ×2 IMPLANT
NDL HYPO 25GX1X1/2 BEV (NEEDLE) ×1 IMPLANT
NEEDLE HYPO 25GX1X1/2 BEV (NEEDLE) ×2 IMPLANT
NS IRRIG 500ML POUR BTL (IV SOLUTION) ×2 IMPLANT
PACK BASIN MINOR ARMC (MISCELLANEOUS) ×2 IMPLANT
SCRUB CHG 4% DYNA-HEX 4OZ (MISCELLANEOUS) ×1 IMPLANT
SUT MNCRL 4-0 (SUTURE) ×1
SUT MNCRL 4-0 27XMFL (SUTURE) ×1
SUT PLAIN GUT 0 (SUTURE) ×4 IMPLANT
SUT VIC AB 0 CT1 36 (SUTURE) IMPLANT
SUT VIC AB 0 SH 27 (SUTURE) IMPLANT
SUT VIC AB 3-0 SH 27 (SUTURE) ×1
SUT VIC AB 3-0 SH 27X BRD (SUTURE) ×1 IMPLANT
SUT VICRYL 0 AB UR-6 (SUTURE) ×4 IMPLANT
SUTURE MNCRL 4-0 27XMF (SUTURE) ×1 IMPLANT
SYR 10ML LL (SYRINGE) ×2 IMPLANT
WATER STERILE IRR 500ML POUR (IV SOLUTION) ×1 IMPLANT

## 2022-07-20 NOTE — Anesthesia Postprocedure Evaluation (Signed)
Anesthesia Post Note  Patient: Midwife  Procedure(s) Performed: AN AD HOC LABOR EPIDURAL  Patient location during evaluation: Mother Baby Anesthesia Type: Epidural Level of consciousness: awake Postop Assessment: no headache Anesthetic complications: no   No notable events documented.   Last Vitals:  Vitals:   07/20/22 0135 07/20/22 0305  BP: 120/70 126/70  Pulse: 87 78  Resp: 18 18  Temp: 36.9 C 36.5 C  SpO2: 97% 97%    Last Pain:  Vitals:   07/20/22 0500  TempSrc:   PainSc: 0-No pain                 Jaye Beagle

## 2022-07-20 NOTE — Anesthesia Postprocedure Evaluation (Signed)
Anesthesia Post Note  Patient: Midwife  Procedure(s) Performed: POST PARTUM TUBAL LIGATION (Abdomen)  Patient location during evaluation: PACU Anesthesia Type: Combined General/Spinal Level of consciousness: awake and alert Pain management: pain level controlled Vital Signs Assessment: post-procedure vital signs reviewed and stable Respiratory status: spontaneous breathing, nonlabored ventilation, respiratory function stable and patient connected to nasal cannula oxygen Cardiovascular status: blood pressure returned to baseline and stable Postop Assessment: no apparent nausea or vomiting Anesthetic complications: no   No notable events documented.   Last Vitals:  Vitals:   07/20/22 1215 07/20/22 1230  BP: 120/87 126/72  Pulse: 81 75  Resp: 18 15  Temp:  (!) 36.1 C  SpO2: 99% 98%    Last Pain:  Vitals:   07/20/22 1253  TempSrc:   PainSc: 6                  Stephanie Coup

## 2022-07-20 NOTE — Anesthesia Preprocedure Evaluation (Signed)
Anesthesia Evaluation  Patient identified by MRN, date of birth, ID band Patient awake    Reviewed: Allergy & Precautions, NPO status , Patient's Chart, lab work & pertinent test results  History of Anesthesia Complications Negative for: history of anesthetic complications  Airway Mallampati: III  TM Distance: >3 FB Neck ROM: full    Dental no notable dental hx. (+) Teeth Intact   Pulmonary neg pulmonary ROS, neg shortness of breath,    Pulmonary exam normal        Cardiovascular (-) angina(-) CAD, (-) Past MI and (-) CABG negative cardio ROS Normal cardiovascular exam     Neuro/Psych negative neurological ROS  negative psych ROS   GI/Hepatic negative GI ROS, Neg liver ROS,   Endo/Other  negative endocrine ROS  Renal/GU      Musculoskeletal   Abdominal   Peds  Hematology negative hematology ROS (+)   Anesthesia Other Findings Patient denied spinal.   Past Medical History: 2012: Abnormal Pap smear of vagina     Comment:  colpo neg No date: Kidney stone No date: Vitamin D deficiency  Past Surgical History: 2014: CESAREAN SECTION 2016: kidney stone remove  BMI    Body Mass Index: 30.42 kg/m      Reproductive/Obstetrics negative OB ROS                             Anesthesia Physical Anesthesia Plan  ASA: 2  Anesthesia Plan: General/Spinal   Post-op Pain Management:    Induction: Intravenous  PONV Risk Score and Plan: 3 and Ondansetron, Dexamethasone, Midazolam and Treatment may vary due to age or medical condition  Airway Management Planned: Oral ETT  Additional Equipment:   Intra-op Plan:   Post-operative Plan: Extubation in OR  Informed Consent: I have reviewed the patients History and Physical, chart, labs and discussed the procedure including the risks, benefits and alternatives for the proposed anesthesia with the patient or authorized representative who has  indicated his/her understanding and acceptance.     Dental Advisory Given  Plan Discussed with: Anesthesiologist, CRNA and Surgeon  Anesthesia Plan Comments: (Patient consented for risks of anesthesia including but not limited to:  - adverse reactions to medications - damage to eyes, teeth, lips or other oral mucosa - nerve damage due to positioning  - sore throat or hoarseness - Damage to heart, brain, nerves, lungs, other parts of body or loss of life  Patient voiced understanding.)        Anesthesia Quick Evaluation

## 2022-07-20 NOTE — Lactation Note (Addendum)
This note was copied from a baby's chart. Lactation Consultation Note  Patient Name: Roberta Wagner KCLEX'N Date: 07/20/2022 Reason for consult: Initial assessment;Term Age:39 hours  Maternal Data This is mom's 4th baby, NSVD. She is an experienced breastfeeding mother.Per mom's report baby began cluster feeding last night. Per mom baby has been latching well. Has patient been taught Hand Expression?: Yes Does the patient have breastfeeding experience prior to this delivery?: Yes How long did the patient breastfeed?: Mom breastfed her previous children for 4-5 months and also pumped and provided milk to extend time period of receiving breastmilk.  Feeding Mother's Current Feeding Choice: Breast Milk Observed mother independently breastfeeding baby. Checked latch and lips are flanged outward on to the areola well. Audible swallows noted.  LATCH Score Latch: Grasps breast easily, tongue down, lips flanged, rhythmical sucking.  Audible Swallowing: Spontaneous and intermittent  Type of Nipple: Everted at rest and after stimulation  Comfort (Breast/Nipple): Soft / non-tender  Hold (Positioning): No assistance needed to correctly position infant at breast.  LATCH Score: 10  Interventions Interventions: Breast feeding basics reviewed;Education  Discharge Discharge Education: Engorgement and breast care;Warning signs for feeding baby;Other (comment) (Mom will bring baby to [facility] for follow-up. Mom is aware of outpatient lactation assistance available at pediatric practice. Also, provided [facility] lactation contact info as additional resource.) Pump: Hands Free (Mom has hands free personal breastpump for home use. Mom will be pumping some in addition to breastfeeding to accumulate milk for when she returns to work.Discussed some strategies to maximize mom's pumping effort.)  Consult Status Consult Status: Complete  Update provided to care nurse.  Fuller Song 07/20/2022, 2:08 PM

## 2022-07-20 NOTE — Anesthesia Procedure Notes (Signed)
Procedure Name: Intubation Date/Time: 07/20/2022 11:12 AM  Performed by: Tammi Klippel, CRNAPre-anesthesia Checklist: Patient identified, Patient being monitored, Timeout performed, Emergency Drugs available and Suction available Patient Re-evaluated:Patient Re-evaluated prior to induction Oxygen Delivery Method: Circle system utilized Preoxygenation: Pre-oxygenation with 100% oxygen Induction Type: IV induction, Rapid sequence and Cricoid Pressure applied Ventilation: Mask ventilation without difficulty Laryngoscope Size: 3 and McGraph Grade View: Grade I Tube type: Oral Tube size: 7.0 mm Number of attempts: 1 Airway Equipment and Method: Stylet Placement Confirmation: ETT inserted through vocal cords under direct vision, positive ETCO2 and breath sounds checked- equal and bilateral Secured at: 22 cm Tube secured with: Tape Dental Injury: Teeth and Oropharynx as per pre-operative assessment  Comments: Miller 2x1 attempt. Not long enough. McGrath 3 x1 attempt. Good view. Atraumatic

## 2022-07-20 NOTE — Progress Notes (Signed)
Patient to OR

## 2022-07-20 NOTE — Transfer of Care (Signed)
Immediate Anesthesia Transfer of Care Note  Patient: Roberta Wagner  Procedure(s) Performed: POST PARTUM TUBAL LIGATION (Abdomen)  Patient Location: PACU  Anesthesia Type:General  Level of Consciousness: drowsy  Airway & Oxygen Therapy: Patient Spontanous Breathing and Patient connected to face mask oxygen  Post-op Assessment: Report given to RN  Post vital signs: stable  Last Vitals:  Vitals Value Taken Time  BP    Temp    Pulse 81 07/20/22 1154  Resp 15 07/20/22 1154  SpO2 98 % 07/20/22 1154  Vitals shown include unvalidated device data.  Last Pain:  Vitals:   07/20/22 0948  TempSrc: Temporal  PainSc: 0-No pain         Complications: No notable events documented.

## 2022-07-20 NOTE — Progress Notes (Signed)
Post Partum Day # 1, s/p SVD  Subjective: no complaints, up ad lib, voiding, and tolerating PO  Objective: Temp:  [97.7 F (36.5 C)-99 F (37.2 C)] 98.7 F (37.1 C) (07/21 0759) Pulse Rate:  [64-146] 78 (07/21 0759) Resp:  [16-18] 18 (07/21 0759) BP: (78-240)/(35-227) 125/61 (07/21 0759) SpO2:  [88 %-100 %] 99 % (07/21 0759) Weight:  [93.4 kg-93.7 kg] 93.4 kg (07/20 1328)  Physical Exam:  General: alert and no distress  Lungs: clear to auscultation bilaterally Breasts: normal appearance, no masses or tenderness Heart: regular rate and rhythm, S1, S2 normal, no murmur, click, rub or gallop Abdomen: soft, non-tender; bowel sounds normal; no masses,  no organomegaly Pelvis: Lochia: appropriate, Uterine Fundus: firm Extremities: DVT Evaluation: No evidence of DVT seen on physical exam.  Recent Labs    07/19/22 1358 07/20/22 0624  HGB 11.0* 11.3*  HCT 33.5* 34.3*    Assessment/Plan: Doing well postpartum Breastfeeding  Contraception: desires PP BTL. Scheduled for today.  Plan for discharge tomorrow   LOS: 1 day   Hildred Laser, MD [facility] 07/20/2022 9:40 AM

## 2022-07-21 ENCOUNTER — Encounter: Payer: Self-pay | Admitting: Obstetrics and Gynecology

## 2022-07-21 DIAGNOSIS — Z302 Encounter for sterilization: Secondary | ICD-10-CM

## 2022-07-21 DIAGNOSIS — O34219 Maternal care for unspecified type scar from previous cesarean delivery: Secondary | ICD-10-CM

## 2022-07-21 MED ORDER — OXYCODONE-ACETAMINOPHEN 5-325 MG PO TABS: 1.0000 | ORAL_TABLET | Freq: Four times a day (QID) | ORAL | 0 refills | Status: DC | PRN

## 2022-07-21 MED ORDER — IBUPROFEN 600 MG PO TABS: 600.0000 mg | ORAL_TABLET | Freq: Four times a day (QID) | ORAL | 1 refills | Status: DC

## 2022-07-21 MED ORDER — DOCUSATE SODIUM 100 MG PO CAPS: 100.0000 mg | ORAL_CAPSULE | Freq: Two times a day (BID) | ORAL | 2 refills | Status: DC | PRN

## 2022-07-21 NOTE — Progress Notes (Signed)
Pt discharged with infant.  Discharge instructions, prescriptions and follow up appointment given to and reviewed with pt. Pt verbalized understanding. Escorted out by staff. 

## 2022-07-21 NOTE — Op Note (Signed)
Procedure(s): POST PARTUM TUBAL LIGATION Procedure Note  [name] female 41 y.o. 07/21/2022  Indications: The patient is a 41 y.o. [id number] female with undesired fertility. She is PPD#1 s/p NSVD.  Pre-operative Diagnosis: Multiparity, undesired fertility  Post-operative Diagnosis: Same  Surgeon: Hildred Laser, MD  Assistants:  None.   Anesthesia: General endotracheal anesthesia  Findings: The uterine outline, fallopian tubes and ovaries appeared normal.  Procedure Details: The patient was seen in the Holding Room. The risks, benefits, complications, treatment options, and expected outcomes were discussed with the patient.  The patient concurred with the proposed plan, giving informed consent.  The site of surgery properly noted/marked. The patient was taken to the Operating Room, identified as [name] and the procedure verified as Procedure(s) (LRB): POST PARTUM TUBAL LIGATION (N/A).   Patient taken to the OR and placed under general anesthesia without difficulty.  She was placed in the supine position. She was prepped and draped in the normal sterile fashion.  Time out to confirm patient identity and procedure was performed.  A small transverse, infraumbilical skin incision was then made with the scalpel. The incision was carried down through the underlying fascia until the peritoneum was identified and entered. The peritoneum was noted to be free of any adhesions and the incision was then extended bluntly.  The patient's left fallopian tube was then identified, brought to the incision, and grasped with a Babcok clamp. The tube was then followed out to the fimbria. The Babcock clamp was then used to grasp the tube approximately 4 cm from the cornual region. A 3 cm segment of tube was then ligated with two free plain gut sutures using Parkland technique and excised. Good hemostasis was noted and the tube was returned to the abdomen. The right fallopian tube was then ligated  in a similar fashion and excised. The tube returned to the abdomen. The fascia was closed using a 0-vicryl in a running fashion and then injected with 10 cc of 0.5% Sensorcaine. The subcutaneous tissue was approximated with 2-0 Vicryl. The skin was closed in a subcuticular fashion using 4-0 monocryl. The skin was injected with 8 cc of 0.5 % Sensorcaine. Dermabond was applied.   Instrument, sponge and lap counts correct x 2. The patient tolerated the procedure well.  She was taken to the PACU in stable condition.    Estimated Blood Loss:  minimal      Drains: None. patient voided prior to procedure         Total IV Fluids: 900 ml  Specimens: segments of right and left fallopian tubes         Implants: None         Complications:  None; patient tolerated the procedure well.         Disposition: PACU - hemodynamically stable.         Condition: stable   Hildred Laser, MD [facility]

## 2022-07-21 NOTE — Progress Notes (Addendum)
Post Partum Day # 2, s/p SVD, POD#1 s/p PP BTL  Subjective: no complaints, up ad lib, voiding, and tolerating PO. Breastfeeding is going well. Bleeding is light to moderate. Pain well controlled with meds.   Objective: Vitals:   07/20/22 1413 07/20/22 2052 07/20/22 2331 07/21/22 0811  BP: 131/67 112/70 121/61 112/64  Pulse: 82 78 77 68  Resp: 18 16 18 18   Temp: 98.4 F (36.9 C) 98.2 F (36.8 C) 97.9 F (36.6 C) 98.4 F (36.9 C)  TempSrc: Oral Oral Oral Oral  SpO2: 99% 100% 97% 99%  Weight:      Height:         Physical Exam:  General: alert and no distress  Lungs: clear to auscultation bilaterally Breasts: normal appearance, no masses or tenderness Heart: regular rate and rhythm, S1, S2 normal, no murmur, click, rub or gallop Abdomen: soft, non-tender; bowel sounds normal; no masses,  no organomegaly. Infraumbilical incision site healing well, no erythema.  Dermabond dressing.  Pelvis: Lochia: appropriate, Uterine Fundus: firm Extremities: DVT Evaluation: No evidence of DVT seen on physical exam.  Recent Labs    07/19/22 1358 07/20/22 0624  HGB 11.0* 11.3*  HCT 33.5* 34.3*    Assessment/Plan: Doing well postpartum Breastfeeding going well Contraception: PP BTL done.  Plan for discharge today   LOS: 2 days   07/22/22, MD [facility] 07/21/2022 8:51 AM

## 2022-07-21 NOTE — Discharge Summary (Signed)
Postpartum Discharge Summary      Patient Name: Roberta Wagner DOB: [birth year] MRN: [id number]  Date of admission: 07/19/2022 Delivery date:07/19/2022  Delivering provider: Lucrezia Europe RENEE  Date of discharge: 07/21/2022  Admitting diagnosis: Full-term premature rupture of membranes [O42.92] Intrauterine pregnancy: [redacted]w[redacted]d    Secondary diagnosis:  Principal Problem:   Full-term premature rupture of membranes Active Problems:   GBS (group B Streptococcus carrier), +RV culture, currently pregnant   History of VBAC   Unwanted fertility   Desires VBAC (vaginal birth after cesarean) trial  Additional problems: None    Discharge diagnosis: Term Pregnancy Delivered and VBAC                                              Post partum procedures:postpartum tubal ligation Augmentation: Pitocin Complications: None  Hospital course: Induction of Labor With Vaginal Delivery   41y.o. yo [id number] admitted to the hospital 07/19/2022 for induction of labor.  Indication for induction: PROM.  Patient had an uncomplicated labor course as follows: Membrane Rupture Time/Date: 11:00 AM ,07/19/2022   Delivery Method:Vaginal, Spontaneous  Episiotomy: None  Lacerations:  None  Details of delivery can be found in separate delivery note.  Patient had a routine postpartum course. Patient is discharged home 07/21/22.  Newborn Data: Birth date:07/19/2022  Birth time:10:44 PM  Gender:Female  Living status:Living  Apgars:8 ,9  Weight:3190 g   Magnesium Sulfate received: No BMZ received: No Rhophylac:No MMR:No T-DaP:Given prenatally Flu: No Transfusion:No  Physical exam  Vitals:   07/20/22 1413 07/20/22 2052 07/20/22 2331 07/21/22 0811  BP: 131/67 112/70 121/61 112/64  Pulse: 82 78 77 68  Resp: _0 Temp: 98.4 F (36.9 C) 98.2 F (36.8 C) 97.9 F (36.6 C) 98.4 F (36.9 C)  TempSrc: Oral Oral Oral Oral  SpO2: 99% 100% 97% 99%  Weight:      Height:        General: alert, cooperative, and no distress Lochia: appropriate Uterine Fundus: firm Incision: Healing well with no significant drainage DVT Evaluation: No evidence of DVT seen on physical exam. Negative Homan's sign. No cords or calf tenderness. No significant calf/ankle edema. Labs: Lab Results  Component Value Date   WBC 13.5 (H) 07/20/2022   HGB 11.3 (L) 07/20/2022   HCT 34.3 (L) 07/20/2022   MCV 82.3 07/20/2022   PLT 258 07/20/2022      Latest Ref Rng & Units 10/27/2020    2:11 PM  CMP  Glucose 70 - 99 mg/dL 74   BUN 6 - 23 mg/dL 18   Creatinine 0.40 - 1.20 mg/dL 1.16   Sodium 135 - 145 mEq/L 138   Potassium 3.5 - 5.1 mEq/L 4.5   Chloride 96 - 112 mEq/L 102   CO2 19 - 32 mEq/L 28   Calcium 8.4 - 10.5 mg/dL 10.3   Total Protein 6.0 - 8.3 g/dL 7.2   Total Bilirubin 0.2 - 1.2 mg/dL 0.6   Alkaline Phos 39 - 117 U/L 40   AST 0 - 37 U/L 23   ALT 0 - 35 U/L 20    Edinburgh Score:     No data to display            After visit meds:  Allergies as of 07/21/2022       Reactions  Sulfa Antibiotics Hives        Medication List     TAKE these medications    docusate sodium 100 MG capsule Commonly known as: COLACE Take 1 capsule (100 mg total) by mouth 2 (two) times daily as needed.   ibuprofen 600 MG tablet Commonly known as: ADVIL Take 1 tablet (600 mg total) by mouth every 6 (six) hours.   multivitamin-prenatal 27-0.8 MG Tabs tablet Take 1 tablet by mouth daily at 12 noon.   oxyCODONE-acetaminophen 5-325 MG tablet Commonly known as: PERCOCET/ROXICET Take 1-2 tablets by mouth every 6 (six) hours as needed.         Discharge home in stable condition Infant Feeding: Breast Infant Disposition:home with mother Discharge instruction: per After Visit Summary and Postpartum booklet. Activity: Advance as tolerated. Pelvic rest for 6 weeks.  Diet: routine diet Anticipated Birth Control: BTL done PP Postpartum Appointment:6 weeks Additional  Postpartum F/U: Postpartum Depression checkup in 2 weeks.  Future Appointments: Future Appointments  Date Time Provider [place]  08/01/2022  4:00 PM Rubie Maid, MD [facility] None   Follow up Visit:  Follow-up Information     Rubie Maid, MD Follow up.   Specialties: Obstetrics and Gynecology, Radiology Contact information: [address] [place] [place] [id number] [phone]                     07/21/2022 Rubie Maid, MD

## 2022-07-23 LAB — SURGICAL PATHOLOGY

## 2022-07-26 LAB — URINE CULTURE

## 2022-07-26 MED ORDER — NITROFURANTOIN MONOHYD MACRO 100 MG PO CAPS: 100.0000 mg | ORAL_CAPSULE | Freq: Two times a day (BID) | ORAL | 0 refills | Status: DC

## 2022-07-26 NOTE — Addendum Note (Signed)
Addended by: Fabian November on: 07/26/2022 05:35 PM   Modules accepted: Orders

## 2022-08-01 ENCOUNTER — Telehealth: Payer: Medicaid Other | Admitting: Obstetrics and Gynecology

## 2022-08-01 NOTE — Progress Notes (Unsigned)
   Virtual Visit via Video Note  I connected with NAME@ on 08/01/22 at   4:00 PM EDT by a video enabled telemedicine application and verified that I am speaking with the correct person using two identifiers.  Location: Patient: home Provider: office   I discussed the limitations of evaluation and management by telemedicine and the availability of in person appointments. The patient expressed understanding and agreed to proceed.    History of Present Illness:   Roberta Wagner is a 41 y.o. [phone] female who presents for a 2 week televisit for mood check. She is 2 weeks postpartum following a spontaneous vaginal delivery.  The delivery was at 39 gestational weeks.  Postpartum course has been well so far. Baby is feeding by breastfeeding. Bleeding: Light. Postpartum depression screening: negative.  EDPS score is 1.      The following portions of the patient's history were reviewed and updated as appropriate: allergies, current medications, past family history, past medical history, past social history, past surgical history, and problem list.   Observations/Objective:   unknown if currently breastfeeding. Gen App: NAD Psych: normal speech, affect. Good mood.        07/21/2022    9:38 AM  Inocente Salles Postnatal Depression Scale Screening Tool  I have been able to laugh and see the funny side of things. 0  I have looked forward with enjoyment to things. 0  I have blamed myself unnecessarily when things went wrong. 1  I have been anxious or worried for no good reason. 0  I have felt scared or panicky for no good reason. 0  Things have been getting on top of me. 1  I have been so unhappy that I have had difficulty sleeping. 0  I have felt sad or miserable. 0  I have been so unhappy that I have been crying. 0  The thought of harming myself has occurred to me. 0  Edinburgh Postnatal Depression Scale Total 2         Assessment and Plan:   1. Encounter for screening for maternal  depression - Screening {FINDINGS; POSITIVE NEGATIVE:[id number]} today. Will rescreen at 6 week postpartum visit. Overall doing well.    2. Postpartum state - Overall doing well. Continue routine postpartum home care.    3. Lactating mother -    Follow Up Instructions:     I discussed the assessment and treatment plan with the patient. The patient was provided an opportunity to ask questions and all were answered. The patient agreed with the plan and demonstrated an understanding of the instructions.   The patient was advised to call back or seek an in-person evaluation if the symptoms worsen or if the condition fails to improve as anticipated.   I provided *** minutes of non-face-to-face time during this encounter.     Hildred Laser, MD [facility]

## 2022-08-13 NOTE — Progress Notes (Unsigned)
   Virtual Visit via Video Note  I connected with NAME@ on 08/13/22 at  11:30 AM EDT by a video enabled telemedicine application and verified that I am speaking with the correct person using two identifiers.  Location: Patient: Home Provider: office   I discussed the limitations of evaluation and management by telemedicine and the availability of in person appointments. The patient expressed understanding and agreed to proceed.    History of Present Illness:   Roberta Wagner is a 41 y.o. [phone] female who presents for 4 week televisit for mood check. She is 4 weeks postpartum following a spontaneous vaginal delivery (VBAC).  The delivery was at 39 gestational weeks.  Postpartum course has been well so far. Baby is feeding by Breast. Bleeding: Stopped. Postpartum depression screening: negative.  EDPS score is 1.      The following portions of the patient's history were reviewed and updated as appropriate: allergies, current medications, past family history, past medical history, past social history, past surgical history, and problem list.   Observations/Objective:   Height 5\' 9"  (1.753 m), weight 193 lb (87.5 kg), unknown if currently breastfeeding. Gen App: NAD Psych: normal speech, affect. Good mood.        08/01/2022    4:44 PM 07/21/2022    9:38 AM  07/23/2022 Postnatal Depression Scale Screening Tool  I have been able to laugh and see the funny side of things. 0 0  I have looked forward with enjoyment to things. 0 0  I have blamed myself unnecessarily when things went wrong. 0 1  I have been anxious or worried for no good reason. 0 0  I have felt scared or panicky for no good reason. 0 0  Things have been getting on top of me. 1 1  I have been so unhappy that I have had difficulty sleeping. 0 0  I have felt sad or miserable. 0 0  I have been so unhappy that I have been crying. 0 0  The thought of harming myself has occurred to me. 0 0  Edinburgh Postnatal Depression  Scale Total 1 2       Assessment and Plan:   1. Encounter for screening for maternal depression - Screening negative today. Will rescreen at 6 week postpartum visit. Overall doing well.    2. Postpartum state - Overall doing well. Continue routine postpartum home care.    3. Lactating mother -  Doing well, no issues with breastfeeding at this time.    Follow Up Instructions:  RTC in 2 weeks for postpartum visit.    I discussed the assessment and treatment plan with the patient. The patient was provided an opportunity to ask questions and all were answered. The patient agreed with the plan and demonstrated an understanding of the instructions.   The patient was advised to call back or seek an in-person evaluation if the symptoms worsen or if the condition fails to improve as anticipated.   I provided 4 minutes of non-face-to-face time during this encounter.     Inocente Salles, MD [facility]

## 2022-08-14 ENCOUNTER — Telehealth (INDEPENDENT_AMBULATORY_CARE_PROVIDER_SITE_OTHER): Payer: Medicaid Other | Admitting: Obstetrics and Gynecology

## 2022-08-14 ENCOUNTER — Encounter: Payer: Self-pay | Admitting: Obstetrics and Gynecology

## 2022-08-14 VITALS — Ht 69.0 in | Wt 193.0 lb

## 2022-08-14 DIAGNOSIS — Z1332 Encounter for screening for maternal depression: Secondary | ICD-10-CM | POA: Diagnosis not present

## 2022-08-29 NOTE — Progress Notes (Unsigned)
   OBSTETRICS POSTPARTUM CLINIC PROGRESS NOTE  Subjective:     Roberta Wagner is a 41 y.o. [phone] female who presents for a postpartum visit. She is 6 weeks postpartum following a spontaneous vaginal delivery. I have fully reviewed the prenatal and intrapartum course. The delivery was at 39.0 gestational weeks.  Anesthesia: epidural. Postpartum course has been ***. Baby's course has been ***. Baby is feeding by {breast/bottle:69}. Bleeding: patient {HAS HAS FOY:77412} not resumed menses, with No LMP recorded.. Bowel function is {normal:32111}. Bladder function is {normal:32111}. Patient {is/is not:9024} sexually active. Contraception method desired is {contraceptive method:5051}. Postpartum depression screening: {neg default:13464::"negative"}.  EDPS score is ***.    The following portions of the patient's history were reviewed and updated as appropriate: allergies, current medications, past family history, past medical history, past social history, past surgical history, and problem list.  Review of Systems {ros; complete:30496}   Objective:    There were no vitals taken for this visit.  General:  alert and no distress   Breasts:  inspection negative, no nipple discharge or bleeding, no masses or nodularity palpable  Lungs: clear to auscultation bilaterally  Heart:  regular rate and rhythm, S1, S2 normal, no murmur, click, rub or gallop  Abdomen: soft, non-tender; bowel sounds normal; no masses,  no organomegaly.  Well healed Pfannenstiel incision   Vulva:  normal  Vagina: normal vagina, no discharge, exudate, lesion, or erythema  Cervix:  no cervical motion tenderness and no lesions  Corpus: normal size, contour, position, consistency, mobility, non-tender  Adnexa:  normal adnexa and no mass, fullness, tenderness  Rectal Exam: Not performed.         Labs:  Lab Results  Component Value Date   HGB 11.3 (L) 07/20/2022     Assessment:   No diagnosis found.   Plan:    1.  Contraception: {method:5051} 2. Will check Hgb for h/o postpartum anemia of less than 10.  3. Follow up in: {1-10:13787} {time; units:19136} or as needed.    Hildred Laser, MD [facility]

## 2022-08-30 ENCOUNTER — Encounter: Payer: Self-pay | Admitting: Obstetrics and Gynecology

## 2022-08-30 ENCOUNTER — Ambulatory Visit (INDEPENDENT_AMBULATORY_CARE_PROVIDER_SITE_OTHER): Payer: Medicaid Other | Admitting: Obstetrics and Gynecology

## 2022-08-30 DIAGNOSIS — Z9851 Tubal ligation status: Secondary | ICD-10-CM

## 2022-08-30 DIAGNOSIS — Z98891 History of uterine scar from previous surgery: Secondary | ICD-10-CM
